# Patient Record
Sex: Female | Born: 1970 | Race: Black or African American | Hispanic: No | Marital: Single | Smoking: Former smoker
Health system: Southern US, Community
[De-identification: ages and names within clinical notes are randomized; demographics above are authoritative.]

## PROBLEM LIST (undated history)

## (undated) DIAGNOSIS — F419 Anxiety disorder, unspecified: Secondary | ICD-10-CM

## (undated) DIAGNOSIS — F32A Depression, unspecified: Secondary | ICD-10-CM

## (undated) DIAGNOSIS — F329 Major depressive disorder, single episode, unspecified: Secondary | ICD-10-CM

## (undated) DIAGNOSIS — M170 Bilateral primary osteoarthritis of knee: Secondary | ICD-10-CM

## (undated) DIAGNOSIS — E785 Hyperlipidemia, unspecified: Secondary | ICD-10-CM

## (undated) HISTORY — DX: Anxiety disorder, unspecified: F41.9

## (undated) HISTORY — DX: Hyperlipidemia, unspecified: E78.5

## (undated) HISTORY — PX: COLONOSCOPY: SHX174

## (undated) HISTORY — DX: Bilateral primary osteoarthritis of knee: M17.0

## (undated) HISTORY — DX: Depression, unspecified: F32.A

## (undated) HISTORY — PX: TUBAL LIGATION: SHX77

## (undated) HISTORY — DX: Major depressive disorder, single episode, unspecified: F32.9

## (undated) HISTORY — PX: POLYPECTOMY: SHX149

## (undated) HISTORY — PX: UPPER GASTROINTESTINAL ENDOSCOPY: SHX188

---

## 2002-10-11 ENCOUNTER — Emergency Department (HOSPITAL_COMMUNITY): Admission: EM | Admit: 2002-10-11 | Discharge: 2002-10-11 | Payer: Self-pay | Admitting: Emergency Medicine

## 2002-11-05 ENCOUNTER — Ambulatory Visit (HOSPITAL_COMMUNITY): Admission: RE | Admit: 2002-11-05 | Discharge: 2002-11-05 | Payer: Self-pay | Admitting: Obstetrics and Gynecology

## 2005-01-10 ENCOUNTER — Ambulatory Visit (HOSPITAL_COMMUNITY): Admission: RE | Admit: 2005-01-10 | Discharge: 2005-01-10 | Payer: Self-pay | Admitting: Obstetrics and Gynecology

## 2009-10-25 ENCOUNTER — Ambulatory Visit (HOSPITAL_COMMUNITY): Admission: RE | Admit: 2009-10-25 | Discharge: 2009-10-25 | Payer: Self-pay | Admitting: Family Medicine

## 2010-06-29 ENCOUNTER — Emergency Department (HOSPITAL_COMMUNITY): Admission: EM | Admit: 2010-06-29 | Discharge: 2010-06-29 | Payer: Self-pay | Admitting: Emergency Medicine

## 2010-10-27 ENCOUNTER — Encounter: Payer: Self-pay | Admitting: Obstetrics and Gynecology

## 2010-10-29 ENCOUNTER — Encounter: Payer: Self-pay | Admitting: Obstetrics and Gynecology

## 2011-02-22 NOTE — Op Note (Signed)
[name], [name]                          ACCOUNT NO.:  [id number]   MEDICAL RECORD NO.:  [id number]                   PATIENT TYPE:  AMB   LOCATION:  DAY                                  FACILITY:  [facility]   PHYSICIAN:  Tilda Burrow, M.D.              DATE OF BIRTH:  [birth year]   DATE OF PROCEDURE:  11/05/2002  DATE OF DISCHARGE:                                 OPERATIVE REPORT   PREOPERATIVE DIAGNOSIS:  Elective sterilization.   POSTOPERATIVE DIAGNOSIS:  Elective sterilization.   PROCEDURE:  Laparoscopic tubal sterilization with Falope rings.   SURGEON:  Christin Bach, M.D.   ASSISTANTAmie Critchley, C.S.T.   ANESTHESIA:  General.   COMPLICATIONS:  None.   FINDINGS:  Normal uterus, tubes, and ovaries.  No evidence of adhesions or  endometriosis.   INDICATION:  Elective permanent sterilization.   DETAILS OF PROCEDURE:  The patient was taken to the operating room, prepped  and  draped for a combined abdominal and vaginal procedure, with Hulka  tenaculum attached to the cervix for uterine  manipulation. Bladder in-and-  out catheterization. An infraumbilical, 1 cm vertical incision, as well as a  transverse suprapubic 1 cm incision. Veress needle was used to introduce  pneumoperitoneum through the umbilical incision with the pneumoperitoneum  easily introduced under 10 mmHg of pressure. Introduction of the Veress  needle was done, carefully elevating the abdominal wall and orienting the  needle toward the pelvis.   The laparoscopic trocar was then carefully introduced into the abdomen using  a similar technique, and the laparoscope was used to visualize normal pelvic  anatomy with no evidence of bleeding or trauma. The suprapubic trocar was  introduced under direct visualization, and then attention was directed to  the left fallopian tube, which was identified up to its fimbriated end,  elevated and a mid-segment loop of the tube was drawn up into the Falope  ring  applier, Marcaine 0.25% applied to the surface of the tube and the  Falope ring applied, inspected, and found to be in satisfactory position.  The opposite tube was then treated in a similar fashion. The mesosalpinx  beneath the Falope ring on each side was then infiltrated with approximately  3 cc of Marcaine 0.25%, using a transabdominal approach with a 22-gauge  spinal needle. Then the laparoscopic equipment was removed after instilling  200 cc of saline into  the abdomen and deflating the abdomen. Subcuticular 4-0 Dexon was used to  close the skin and Steri-Strips were placed on the skin surface. Sponge and  needle counts were correct. The patient tolerated the procedure well, was  awakened, and went to the recovery room in good condition.  Tilda Burrow, M.D.    JVF/MEDQ  D:  11/05/2002  T:  11/05/2002  Job:  [id number]

## 2011-02-22 NOTE — H&P (Signed)
   NAME:  [name] NO.:  [id number]   MEDICAL RECORD NO.:  [id number]                  PATIENT TYPE:   LOCATION:                                       FACILITY:   PHYSICIAN:  Tilda Burrow, M.D.              DATE OF BIRTH:  December 12, 1970   DATE OF ADMISSION:  11/05/2002  DATE OF DISCHARGE:                                HISTORY & PHYSICAL   ADMISSION DIAGNOSIS:  Desire for elective permanent sterilization.   HISTORY OF PRESENT ILLNESS:  This 40 year old female, gravida 3, para 1, now  with a single child three years of age, who has decided she wants permanent  sterilization, has been seen in our office since December on three occasions  to confirm permanent sterilization request.  She understands the permanence  of the requested procedure, declines non-permanent method, has received  Krene's instruction booklets, and has had these details reviewed.  Specific  failure rate of one in 100 is reviewed with the patient, emphasizing Falope  ring application technique.   PAST MEDICAL HISTORY:  Depression and depressive mood disorder, followed at  mental health recently.   PAST SURGICAL HISTORY:  D&C in 1988 for miscarriage.   ALLERGIES:  None.   SOCIAL HISTORY:  Single.  Lives with parents.   PHYSICAL EXAMINATION:  VITAL SIGNS:  Height 5 feet 9 inches, weight 225.  Blood pressure 125/75.  Urinalysis negative.  Pulse 70.  GENERAL:  A healthy, somewhat somber affect African-American female, alert  and oriented x3.  HEENT:  Pupils equal, round, and reactive, extraocular movements intact.  NECK:  Supple, trachea midline.  CHEST:  Clear to auscultation.  ABDOMEN:  Obese, no masses, well-healed, no hernias appreciable.  Nontender  umbilicus.  PELVIC:  External genitalia with normal cervix, multiparous.  Pap smear  class I 09/15/02.  Uterus anterior, normal size, shape, and contour.  Normal  menses on 10/25/01.  Adnexa negative for masses.  EXTREMITIES:  Grossly within normal limits.   ASSESSMENT:  Desire for elective permanent sterilization.   PLAN:  Laparoscopic tubal sterilization with Falope rings on 11/05/02 at 7:30  a.m.                                               Tilda Burrow, M.D.    JVF/MEDQ  D:  10/26/2002  T:  10/26/2002  Job:  [id number]

## 2012-12-14 ENCOUNTER — Other Ambulatory Visit (HOSPITAL_COMMUNITY): Payer: Self-pay | Admitting: *Deleted

## 2012-12-14 DIAGNOSIS — Z1231 Encounter for screening mammogram for malignant neoplasm of breast: Secondary | ICD-10-CM

## 2012-12-17 ENCOUNTER — Ambulatory Visit (HOSPITAL_COMMUNITY)
Admission: RE | Admit: 2012-12-17 | Discharge: 2012-12-17 | Disposition: A | Discharge status: Home / Self Care | Payer: Self-pay | Source: Ambulatory Visit | Attending: *Deleted | Admitting: *Deleted

## 2014-06-17 ENCOUNTER — Other Ambulatory Visit (HOSPITAL_COMMUNITY): Payer: Self-pay | Admitting: *Deleted

## 2014-06-17 DIAGNOSIS — Z1231 Encounter for screening mammogram for malignant neoplasm of breast: Secondary | ICD-10-CM

## 2014-06-23 ENCOUNTER — Ambulatory Visit (HOSPITAL_COMMUNITY): Payer: Self-pay

## 2015-05-03 ENCOUNTER — Other Ambulatory Visit (HOSPITAL_COMMUNITY): Payer: Self-pay | Admitting: Unknown Physician Specialty

## 2015-05-03 DIAGNOSIS — Z1231 Encounter for screening mammogram for malignant neoplasm of breast: Secondary | ICD-10-CM

## 2015-05-08 ENCOUNTER — Ambulatory Visit (HOSPITAL_COMMUNITY)
Admission: RE | Admit: 2015-05-08 | Discharge: 2015-05-08 | Disposition: A | Discharge status: Home / Self Care | Payer: Medicaid Other | Source: Ambulatory Visit | Attending: Unknown Physician Specialty | Admitting: Unknown Physician Specialty

## 2015-05-08 DIAGNOSIS — Z1231 Encounter for screening mammogram for malignant neoplasm of breast: Secondary | ICD-10-CM | POA: Insufficient documentation

## 2016-05-13 ENCOUNTER — Other Ambulatory Visit (HOSPITAL_COMMUNITY): Payer: Self-pay | Admitting: *Deleted

## 2016-05-13 DIAGNOSIS — Z1231 Encounter for screening mammogram for malignant neoplasm of breast: Secondary | ICD-10-CM

## 2016-05-15 ENCOUNTER — Ambulatory Visit (HOSPITAL_COMMUNITY): Payer: Medicaid Other

## 2017-01-07 ENCOUNTER — Ambulatory Visit: Payer: Medicaid Other | Admitting: Family Medicine

## 2017-01-23 ENCOUNTER — Encounter: Payer: Self-pay | Admitting: Family Medicine

## 2017-01-23 ENCOUNTER — Ambulatory Visit (INDEPENDENT_AMBULATORY_CARE_PROVIDER_SITE_OTHER): Payer: BLUE CROSS/BLUE SHIELD | Admitting: Family Medicine

## 2017-01-23 VITALS — BP 134/74 | HR 84 | Temp 97.9°F | Resp 18 | Ht 69.0 in | Wt 268.1 lb

## 2017-01-23 DIAGNOSIS — K7689 Other specified diseases of liver: Secondary | ICD-10-CM | POA: Insufficient documentation

## 2017-01-23 DIAGNOSIS — F418 Other specified anxiety disorders: Secondary | ICD-10-CM | POA: Diagnosis not present

## 2017-01-23 DIAGNOSIS — M170 Bilateral primary osteoarthritis of knee: Secondary | ICD-10-CM | POA: Diagnosis not present

## 2017-01-23 DIAGNOSIS — Z7689 Persons encountering health services in other specified circumstances: Secondary | ICD-10-CM

## 2017-01-23 DIAGNOSIS — M160 Bilateral primary osteoarthritis of hip: Secondary | ICD-10-CM | POA: Diagnosis not present

## 2017-01-23 HISTORY — DX: Bilateral primary osteoarthritis of knee: M17.0

## 2017-01-23 LAB — CBC
HEMATOCRIT: 35.3 % (ref 35.0–45.0)
Hemoglobin: 11.3 g/dL — ABNORMAL LOW (ref 11.7–15.5)
MCH: 28 pg (ref 27.0–33.0)
MCHC: 32 g/dL (ref 32.0–36.0)
MCV: 87.6 fL (ref 80.0–100.0)
MPV: 9.7 fL (ref 7.5–12.5)
PLATELETS: 351 10*3/uL (ref 140–400)
RBC: 4.03 MIL/uL (ref 3.80–5.10)
RDW: 14.7 % (ref 11.0–15.0)
WBC: 6 10*3/uL (ref 3.8–10.8)

## 2017-01-23 NOTE — Patient Instructions (Signed)
Need old records for GYN Need recent record from urgent care  Continue to walk every day that you are able Need mammogram this year  Need blood testing  See a counselor/therapist for your depression  See me in one month

## 2017-01-23 NOTE — Progress Notes (Addendum)
Chief Complaint  Patient presents with  . Establish Care   This is a new patient to establish. Old records are not available. She is here because of a recent urgent care visit. She was found on CAT scan to have a hepatic cyst. She would like explanation of this finding, and appropriate follow-up. It has caused her some concern. We had a discussion regarding simple hepatic cysts, their benign nature, and appropriate follow-up. She has morbid obesity. She weighs 268. She tells me her goal weight is 240 pounds. I corrected her that she really needs to try to get under 200 pounds. She has osteoarthritis of her hips and knees, some difficulty walking. This will only get worse if she doesn't lose weight. Her other big concern is chronic depression. She currently feels depressed. She has been on multiple medications. She prefers not to take medication. No thoughts of harming self or others. She really needs to get into a counselor if she chooses not to take medication. She had a Pap smear couple years ago at [facility]. This record will be requested. She is overdue for mammogram testing. Her last tetanus shot is unknown. She refuses flu shots.   Patient Active Problem List   Diagnosis Date Noted  . Morbid obesity (HCC) 01/23/2017  . Depression with anxiety 01/23/2017  . Primary osteoarthritis of both hips 01/23/2017  . Primary osteoarthritis of both knees 01/23/2017  . Simple hepatic cyst 01/23/2017    No outpatient encounter prescriptions on file as of 01/23/2017.   No facility-administered encounter medications on file as of 01/23/2017.     Past Medical History:  Diagnosis Date  . Anxiety   . Depression   . Primary osteoarthritis of both knees 01/23/2017    Past Surgical History:  Procedure Laterality Date  . TUBAL LIGATION      Social History   Social History  . Marital status: Single    Spouse name: N/A  . Number of children: 1  . Years of education: 82    Occupational History  . teacher asst     [employer]   Social History Main Topics  . Smoking status: Former Games developer  . Smokeless tobacco: Never Used  . Alcohol use No  . Drug use: No  . Sexual activity: Not Currently    Birth control/ protection: None   Other Topics Concern  . Not on file   Social History Narrative   Lives with mother and son [name]   Working on degree in early childhood interdisciplinary studies    Family History  Problem Relation Age of Onset  . Diabetes Mother   . Hypertension Mother   . Stroke Father   . Dementia Father   . Early death Brother     MVA    Review of Systems  Constitutional: Negative for chills, fever and weight loss.  HENT: Negative for congestion and hearing loss.   Eyes: Negative for blurred vision and pain.  Respiratory: Negative for cough and shortness of breath.   Cardiovascular: Negative for chest pain and leg swelling.  Gastrointestinal: Negative for abdominal pain, constipation, diarrhea and heartburn.  Genitourinary: Negative for dysuria and frequency.  Musculoskeletal: Positive for joint pain. Negative for falls and myalgias.       In hips and knees  Neurological: Negative for dizziness, seizures and headaches.  Psychiatric/Behavioral: Positive for depression. Negative for substance abuse and suicidal ideas. The patient is nervous/anxious. The patient does not have insomnia.  BP 134/74 (BP Location: Right Arm, Patient Position: Sitting, Cuff Size: Large)   Pulse 84   Temp 97.9 F (36.6 C) (Temporal)   Resp 18   Ht  (1.753 m)   Wt 268 lb 1.3 oz (121.6 kg)   LMP 01/18/2017 (Exact Date)   SpO2 99%   BMI 39.59 kg/m   Physical Exam  Constitutional: She is oriented to person, place, and time. She appears well-developed and well-nourished.  HENT:  Head: Normocephalic and atraumatic.  Right Ear: External ear normal.  Left Ear: External ear normal.  Mouth/Throat: Oropharynx is clear and moist.  Eyes:  Conjunctivae are normal. Pupils are equal, round, and reactive to light.  Neck: Normal range of motion. Neck supple. No thyromegaly present.  Cardiovascular: Normal rate, regular rhythm and normal heart sounds.   Pulmonary/Chest: Effort normal and breath sounds normal. No respiratory distress.  Abdominal: Soft. Bowel sounds are normal.  Musculoskeletal: Normal range of motion. She exhibits no edema.  Antalgic gait. Valgus deformity of knees. Crepitus with range of motion of knees.  Lymphadenopathy:    She has no cervical adenopathy.  Neurological: She is alert and oriented to person, place, and time.  Skin: Skin is warm and dry.  Psychiatric: Her behavior is normal. Thought content normal.  Depressed mood. Hesitant speech  Nursing note and vitals reviewed.  ASSESSMENT/PLAN:  1. Depression with anxiety - CBC - Comprehensive metabolic panel - Hemoglobin A1c - Lipid panel - VITAMIN D 25 Hydroxy (Vit-D Deficiency, Fractures) - Urinalysis, Routine w reflex microscopic Counseling is recommended.  2. Morbid obesity (HCC) Discussed portion control. Low-fat diet. Daily exercise. Offered nutrition consult, declined.  3. Primary osteoarthritis of both hips Discussed arthritis of hips and knees. Offered orthopedic consult. Patient declines. Weight loss recommended.  4. Primary osteoarthritis of both knees As above  5. Simple hepatic cyst Benign nature of hepatic cysts is discussed. We'll repeat ultrasound or scan of abdomen in one year. We'll check liver function tests.  6. Encounter to establish care with new doctor Old records requested regarding primary care/health maintenance  Patient Instructions  Need old records for GYN Need recent record from urgent care  Continue to walk every day that you are able Need mammogram this year  Need blood testing  See a counselor/therapist for your depression  See me in one month     Eustace Moore, MD

## 2017-01-24 ENCOUNTER — Encounter: Payer: Self-pay | Admitting: Family Medicine

## 2017-01-24 LAB — URINALYSIS, ROUTINE W REFLEX MICROSCOPIC
Bilirubin Urine: NEGATIVE
GLUCOSE, UA: NEGATIVE
Ketones, ur: NEGATIVE
LEUKOCYTES UA: NEGATIVE
NITRITE: NEGATIVE
PH: 6 (ref 5.0–8.0)
Protein, ur: NEGATIVE
SPECIFIC GRAVITY, URINE: 1.028 (ref 1.001–1.035)

## 2017-01-24 LAB — COMPREHENSIVE METABOLIC PANEL
ALK PHOS: 48 U/L (ref 33–115)
ALT: 8 U/L (ref 6–29)
AST: 13 U/L (ref 10–35)
Albumin: 4 g/dL (ref 3.6–5.1)
BUN: 10 mg/dL (ref 7–25)
CALCIUM: 9 mg/dL (ref 8.6–10.2)
CHLORIDE: 105 mmol/L (ref 98–110)
CO2: 25 mmol/L (ref 20–31)
Creat: 0.83 mg/dL (ref 0.50–1.10)
Glucose, Bld: 87 mg/dL (ref 65–99)
POTASSIUM: 4 mmol/L (ref 3.5–5.3)
Sodium: 142 mmol/L (ref 135–146)
TOTAL PROTEIN: 6.9 g/dL (ref 6.1–8.1)
Total Bilirubin: 0.2 mg/dL (ref 0.2–1.2)

## 2017-01-24 LAB — LIPID PANEL
CHOLESTEROL: 226 mg/dL — AB (ref <200)
HDL: 54 mg/dL (ref >50)
LDL CALC: 149 mg/dL — AB (ref <100)
TRIGLYCERIDES: 113 mg/dL (ref <150)
Total CHOL/HDL Ratio: 4.2 Ratio (ref <5.0)
VLDL: 23 mg/dL (ref <30)

## 2017-01-24 LAB — VITAMIN D 25 HYDROXY (VIT D DEFICIENCY, FRACTURES): Vit D, 25-Hydroxy: 13 ng/mL — ABNORMAL LOW (ref 30–100)

## 2017-01-24 LAB — HEMOGLOBIN A1C
Hgb A1c MFr Bld: 5.2 % (ref <5.7)
Mean Plasma Glucose: 103 mg/dL

## 2017-01-24 LAB — URINALYSIS, MICROSCOPIC ONLY
Bacteria, UA: NONE SEEN [HPF]
CRYSTALS: NONE SEEN [HPF]
Casts: NONE SEEN [LPF]
RBC / HPF: NONE SEEN RBC/HPF (ref <=2)
Yeast: NONE SEEN [HPF]

## 2017-01-24 MED ORDER — VITAMIN D (ERGOCALCIFEROL) 1.25 MG (50000 UNIT) PO CAPS: 50000.0000 [IU] | ORAL_CAPSULE | ORAL | 0 refills | Status: DC

## 2017-02-24 ENCOUNTER — Ambulatory Visit (INDEPENDENT_AMBULATORY_CARE_PROVIDER_SITE_OTHER): Payer: BLUE CROSS/BLUE SHIELD | Admitting: Family Medicine

## 2017-02-24 ENCOUNTER — Encounter: Payer: Self-pay | Admitting: Family Medicine

## 2017-02-24 VITALS — BP 132/80 | HR 80 | Temp 97.7°F | Resp 18 | Ht 69.0 in | Wt 275.0 lb

## 2017-02-24 DIAGNOSIS — E559 Vitamin D deficiency, unspecified: Secondary | ICD-10-CM | POA: Insufficient documentation

## 2017-02-24 DIAGNOSIS — E785 Hyperlipidemia, unspecified: Secondary | ICD-10-CM

## 2017-02-24 DIAGNOSIS — F418 Other specified anxiety disorders: Secondary | ICD-10-CM | POA: Diagnosis not present

## 2017-02-24 HISTORY — DX: Hyperlipidemia, unspecified: E78.5

## 2017-02-24 MED ORDER — PHENTERMINE HCL 37.5 MG PO CAPS: 37.5000 mg | ORAL_CAPSULE | ORAL | 2 refills | Status: DC

## 2017-02-24 NOTE — Progress Notes (Signed)
    Chief Complaint  Patient presents with  . Follow-up    1 month  has gained weight Very discouraged Discussed diet and exercise Offered nutrition referral - declined Offered bariatric referral - declined She needs to exercise but has significant painful DJD of knees and hips Offered Ortho referral - declined  We agree to try an appetite suppressant. She will work on portions. Fats, calories and sweets.  Drink only water.  Start riding Moms exercise bike daily , 5 min to start and advance.  Return for a weigh-in in one month,  Discussed vit d Discussed cholesterol from recent labs  Patient Active Problem List   Diagnosis Date Noted  . Vitamin D deficiency 02/24/2017  . HLD (hyperlipidemia) 02/24/2017  . Morbid obesity (HCC) 01/23/2017  . Depression with anxiety 01/23/2017  . Primary osteoarthritis of both hips 01/23/2017  . Primary osteoarthritis of both knees 01/23/2017  . Simple hepatic cyst 01/23/2017    Outpatient Encounter Prescriptions as of 02/24/2017  Medication Sig  . Vitamin D, Ergocalciferol, (DRISDOL) 50000 units CAPS capsule Take 1 capsule (50,000 Units total) by mouth every 7 (seven) days.  . phentermine 37.5 MG capsule Take 1 capsule (37.5 mg total) by mouth every morning.   No facility-administered encounter medications on file as of 02/24/2017.     Allergies  Allergen Reactions  . Aspirin Other (See Comments)    Review of Systems    BP 132/80 (BP Location: Right Arm, Patient Position: Sitting, Cuff Size: Large)   Pulse 80   Temp 97.7 F (36.5 C) (Temporal)   Resp 18   Ht 5\' 9"  (1.753 m)   Wt 275 lb (124.7 kg)   LMP 02/19/2017 (Exact Date)   SpO2 98%   BMI 40.61 kg/m   Physical Exam  ASSESSMENT/PLAN:  1. Depression with anxiety stable  2. Vitamin D deficiency take OTC - VITAMIN D 25 Hydroxy (Vit-D Deficiency, Fractures)  3. Hyperlipidemia, unspecified hyperlipidemia type Diet recomended - Lipid panel  4. Morbid obesity  (HCC) plan of diet and exercise discussed - CBC - COMPLETE METABOLIC PANEL WITH GFR   Patient Instructions  Try to exercise as much as you are able Cut down on portions and fatty foods Stop the sweet drinks Take the phentermine daily Call if you have side effects Come in one month at your convenience for a weight Take vitamin D 2000 daily Watch the cholesterol  See me in 3-6 months Call for poroblems   Eustace MooreYvonne Sue Adasha Boehme, MD

## 2017-02-24 NOTE — Patient Instructions (Signed)
Try to exercise as much as you are able Cut down on portions and fatty foods Stop the sweet drinks Take the phentermine daily Call if you have side effects Come in one month at your convenience for a weight Take vitamin D 2000 daily Watch the cholesterol  See me in 3-6 months Call for poroblems

## 2017-03-28 ENCOUNTER — Other Ambulatory Visit: Payer: Self-pay | Admitting: Family Medicine

## 2017-03-28 DIAGNOSIS — Z1231 Encounter for screening mammogram for malignant neoplasm of breast: Secondary | ICD-10-CM

## 2017-04-08 ENCOUNTER — Ambulatory Visit: Payer: BLUE CROSS/BLUE SHIELD

## 2017-04-10 ENCOUNTER — Encounter: Payer: Self-pay | Admitting: Family Medicine

## 2017-04-10 ENCOUNTER — Ambulatory Visit (INDEPENDENT_AMBULATORY_CARE_PROVIDER_SITE_OTHER): Payer: BLUE CROSS/BLUE SHIELD | Admitting: Family Medicine

## 2017-04-10 VITALS — BP 128/78 | HR 70 | Temp 97.5°F | Resp 18 | Ht 69.0 in | Wt 278.1 lb

## 2017-04-10 DIAGNOSIS — M160 Bilateral primary osteoarthritis of hip: Secondary | ICD-10-CM | POA: Diagnosis not present

## 2017-04-10 DIAGNOSIS — M170 Bilateral primary osteoarthritis of knee: Secondary | ICD-10-CM | POA: Diagnosis not present

## 2017-04-10 DIAGNOSIS — Z0000 Encounter for general adult medical examination without abnormal findings: Secondary | ICD-10-CM | POA: Diagnosis not present

## 2017-04-10 MED ORDER — METHYLPREDNISOLONE 4 MG PO TBPK: ORAL_TABLET | ORAL | 0 refills | Status: DC

## 2017-04-10 MED ORDER — MELOXICAM 15 MG PO TABS: 15.0000 mg | ORAL_TABLET | Freq: Every day | ORAL | 0 refills | Status: DC

## 2017-04-10 NOTE — Progress Notes (Signed)
Chief Complaint  Patient presents with  . Annual Exam   Patient is here for a physical examination. We discussed the blood work that was done at her last visit. She is taking a vitamin D supplement. She is trying to reduce her cholesterol. She has not lost weight. She is unable to exercise. She states that her hip pain has become quite severe. She does take over-the-counter Advil or Aleve. These give her very little relief. She had a CT of her abdomen done recently which did show degeneration of both hip joints. She has not had hip x-rays were seeing a specialist for her hip pain. She states her Pap smear was last year. Her mammogram has been ordered but not yet done. She states her immunizations are up-to-date. Will request those records No other health concerns are identified. Patient does realize that her obesity is exacerbating her orthopedic problems, but feels unable to lose weight. She declines referral for diet information, but does accept a referral for orthopedic surgery to see if her hip pain can be improved, and her activity increased  Patient Active Problem List   Diagnosis Date Noted  . Vitamin D deficiency 02/24/2017  . HLD (hyperlipidemia) 02/24/2017  . Morbid obesity (HCC) 01/23/2017  . Depression with anxiety 01/23/2017  . Primary osteoarthritis of both hips 01/23/2017  . Primary osteoarthritis of both knees 01/23/2017  . Simple hepatic cyst 01/23/2017    Outpatient Encounter Prescriptions as of 04/10/2017  Medication Sig  . cholecalciferol (VITAMIN D) 1000 units tablet Take 2,000 Units by mouth daily.  . meloxicam (MOBIC) 15 MG tablet Take 1 tablet (15 mg total) by mouth daily.  . methylPREDNISolone (MEDROL DOSEPAK) 4 MG TBPK tablet Take as directed  . [DISCONTINUED] phentermine 37.5 MG capsule Take 1 capsule (37.5 mg total) by mouth every morning. (Patient not taking: Reported on 04/10/2017)  . [DISCONTINUED] Vitamin D, Ergocalciferol, (DRISDOL) 50000 units CAPS  capsule Take 1 capsule (50,000 Units total) by mouth every 7 (seven) days. (Patient not taking: Reported on 04/10/2017)   No facility-administered encounter medications on file as of 04/10/2017.     No Known Allergies  Review of Systems  Constitutional: Negative for activity change, appetite change and unexpected weight change.  HENT: Negative for congestion, dental problem, postnasal drip and rhinorrhea.   Eyes: Negative for redness and visual disturbance.  Respiratory: Negative for cough and shortness of breath.   Cardiovascular: Negative for chest pain, palpitations and leg swelling.  Gastrointestinal: Negative for abdominal pain, constipation and diarrhea.  Genitourinary: Negative for difficulty urinating, frequency and menstrual problem.  Musculoskeletal: Positive for arthralgias, gait problem and joint swelling. Negative for back pain.  Neurological: Negative for dizziness and headaches.  Psychiatric/Behavioral: Negative for dysphoric mood and sleep disturbance. The patient is not nervous/anxious.     BP 128/78 (BP Location: Left Arm, Patient Position: Sitting, Cuff Size: Large)   Pulse 70   Temp (!) 97.5 F (36.4 C) (Temporal)   Resp 18   Ht 5\' 9"  (1.753 m)   Wt 278 lb 1.9 oz (126.2 kg)   LMP 04/03/2017 (Exact Date)   SpO2 94%   BMI 41.07 kg/m   Physical Exam BP 128/78 (BP Location: Left Arm, Patient Position: Sitting, Cuff Size: Large)   Pulse 70   Temp (!) 97.5 F (36.4 C) (Temporal)   Resp 18   Ht 5\' 9"  (1.753 m)   Wt 278 lb 1.9 oz (126.2 kg)   LMP 04/03/2017 (Exact Date)   SpO2  94%   BMI 41.07 kg/m   General Appearance:    Alert, cooperative, Appears uncomfortable and in distress, appears stated age. Painful movements, very antalgic gait   Head:    Normocephalic, without obvious abnormality, atraumatic  Eyes:    PERRL, conjunctiva/corneas clear, EOM's intact, fundi    benign, both eyes  Ears:    Normal TM's and external ear canals, both ears  Nose:   Nares  normal, septum midline, mucosa normal, no drainage    or sinus tenderness  Throat:   Lips, mucosa, and tongue normal; teeth and gums normal  Neck:   Supple, symmetrical, trachea midline, no adenopathy;    thyroid:  no enlargement/tenderness/nodules; no carotid   bruit   Back:     Symmetric, no curvature, ROM normal, no CVA tenderness  Lungs:     Clear to auscultation bilaterally, respirations unlabored  Chest Wall:    No tenderness or deformity   Heart:    Regular rate and rhythm, S1 and S2 normal, no murmur, rub   or gallop  Breast Exam:    No tenderness, masses, or nipple abnormality  Abdomen:     Soft, non-tender, bowel sounds active all four quadrants,    no masses, no organomegaly  Genitalia:    Normal female   Extremities:   Extremities normal, atraumatic, no cyanosis or edema. Very limited range of motion hip. Valgus deformity of knees. Knees have crepitus.   Pulses:   2+ and symmetric all extremities  Skin:   Skin color, texture, turgor normal, no rashes or lesions  Lymph nodes:   Cervical, supraclavicular, and axillary nodes normal  Neurologic:   CNII-XII intact, normal strength, sensation and reflexes    throughout    ASSESSMENT/PLAN:  1. Primary osteoarthritis of both hips  - Ambulatory referral to Orthopedic Surgery  2. Primary osteoarthritis of both knees Discussed conservative treatment of her arthritis. This includes diet, exercise, anti-inflammatories, weight reduction. If her pain cannot be improved with these methods, she may need hip surgery.  3. Annual physical exam Health maintenance is up-to-date. Immunization records are pending.   Patient Instructions  Take the medrol pak as directed Take all of day one pills today After the medrol, start mobic once a day with food If needed, may also take acetaminophen Need a flu shot in the fall  I have placed referral to orthopedics We will call about mammogram  See me yearly Call sooner for  problems   Eustace Moore, MD

## 2017-04-10 NOTE — Patient Instructions (Signed)
Take the medrol pak as directed Take all of day one pills today After the medrol, start mobic once a day with food If needed, may also take acetaminophen Need a flu shot in the fall  I have placed referral to orthopedics We will call about mammogram  See me yearly Call sooner for problems

## 2017-04-17 ENCOUNTER — Ambulatory Visit (HOSPITAL_COMMUNITY)
Admission: RE | Admit: 2017-04-17 | Discharge: 2017-04-17 | Disposition: A | Discharge status: Home / Self Care | Payer: BLUE CROSS/BLUE SHIELD | Source: Ambulatory Visit | Attending: Family Medicine | Admitting: Family Medicine

## 2017-04-17 DIAGNOSIS — Z1231 Encounter for screening mammogram for malignant neoplasm of breast: Secondary | ICD-10-CM | POA: Diagnosis present

## 2017-04-23 ENCOUNTER — Ambulatory Visit (INDEPENDENT_AMBULATORY_CARE_PROVIDER_SITE_OTHER): Payer: BLUE CROSS/BLUE SHIELD

## 2017-04-23 ENCOUNTER — Encounter: Payer: Self-pay | Admitting: Orthopaedic Surgery

## 2017-04-23 ENCOUNTER — Ambulatory Visit (INDEPENDENT_AMBULATORY_CARE_PROVIDER_SITE_OTHER): Payer: Self-pay | Admitting: Orthopaedic Surgery

## 2017-04-23 VITALS — BP 122/61 | HR 73 | Temp 97.5°F | Ht 70.0 in | Wt 282.0 lb

## 2017-04-23 DIAGNOSIS — M25552 Pain in left hip: Secondary | ICD-10-CM | POA: Diagnosis not present

## 2017-04-23 DIAGNOSIS — M87052 Idiopathic aseptic necrosis of left femur: Secondary | ICD-10-CM

## 2017-04-23 NOTE — Progress Notes (Signed)
Subjective:    Patient ID: [name], female    DOB: [birth year], 46 y.o.   MRN: [id number]  HPI She has had pain of the left hip on and off for over three years.  At first she had a "catch" at times but it lasted only a second or two.  Over the last six to eight weeks she has had much more pain of the left hip.  It hurts to twist, hurts when she first bears weight on it, hurts now at night.  She has no trauma, no redness, no numbness.  She had a CT scan of the abdomen recently which showed some degenerative changes of the hip.  She saw Dr. Delton See who began Mobic about a week ago.  She had been on prednisone dose pack first which really helped.  The Mobic is helping but she still has pain, but much less.  She has noticed she does not have the ROM of the left hip as she has of the right hip.  She has no distal edema.  She is concerned about the increasing pain and decreased motion.   Review of Systems  HENT: Negative for congestion.   Respiratory: Negative for cough and shortness of breath.   Cardiovascular: Negative for chest pain and leg swelling.  Endocrine: Positive for cold intolerance.  Musculoskeletal: Positive for arthralgias and gait problem.  Allergic/Immunologic: Positive for environmental allergies.  Psychiatric/Behavioral: The patient is nervous/anxious.    Past Medical History:  Diagnosis Date  . Anxiety   . Depression   . HLD (hyperlipidemia) 02/24/2017  . Primary osteoarthritis of both knees 01/23/2017    Past Surgical History:  Procedure Laterality Date  . TUBAL LIGATION      Current Outpatient Prescriptions on File Prior to Visit  Medication Sig Dispense Refill  . cholecalciferol (VITAMIN D) 1000 units tablet Take 2,000 Units by mouth daily.    . meloxicam (MOBIC) 15 MG tablet Take 1 tablet (15 mg total) by mouth daily. 30 tablet 0  . methylPREDNISolone (MEDROL DOSEPAK) 4 MG TBPK tablet Take as directed (Patient not taking: Reported on 04/23/2017) 21 tablet  0   No current facility-administered medications on file prior to visit.     Social History   Social History  . Marital status: Single    Spouse name: N/A  . Number of children: 1  . Years of education: 60   Occupational History  . teacher asst     [employer]   Social History Main Topics  . Smoking status: Former Games developer  . Smokeless tobacco: Never Used  . Alcohol use No  . Drug use: No  . Sexual activity: Not Currently    Birth control/ protection: None   Other Topics Concern  . Not on file   Social History Narrative   Lives with mother and son [name]   Working on degree in early childhood interdisciplinary studies    Family History  Problem Relation Age of Onset  . Diabetes Mother   . Hypertension Mother   . Stroke Father   . Dementia Father   . Early death Brother        MVA    BP 122/61   Pulse 73   Temp (!) 97.5 F (36.4 C)   Ht 5\' 10"  (1.778 m)   Wt 282 lb (127.9 kg)   LMP 04/03/2017 (Exact Date)   BMI 40.46 kg/m  '     Objective:   Physical Exam  Constitutional: She  is oriented to person, place, and time. She appears well-developed and well-nourished.  HENT:  Head: Normocephalic and atraumatic.  Eyes: Pupils are equal, round, and reactive to light. Conjunctivae and EOM are normal.  Neck: Normal range of motion. Neck supple.  Cardiovascular: Normal rate, regular rhythm and intact distal pulses.   Pulmonary/Chest: Effort normal.  Abdominal: Soft.  Musculoskeletal: She exhibits tenderness (Left hip is painful to move, ROM flexion 90, internal 20, external 15, adduction and abduction painful, extension 10, NV intact.  Limp left.  No pain over hip with compression.).  Neurological: She is alert and oriented to person, place, and time. She displays normal reflexes. No cranial nerve deficit. She exhibits normal muscle tone. Coordination normal.  Skin: Skin is warm and dry.  Psychiatric: She has a normal mood and affect. Her behavior is normal.  Judgment and thought content normal.  Vitals reviewed.   X-rays were done of the hip on the left and AP pelvis, reported separately.      Assessment & Plan:   Encounter Diagnoses  Name Primary?  . Pain of left hip joint Yes  . Avascular necrosis of hip, left (HCC)    I have explained about avascular necrosis of the hip.  I have ordered a MRI of the hip.  She is to continue the Mobic.  Return after MRI has been done.  She will have an open unit MRI.  Call if any problem.  Precautions discussed.   Electronically Signed Darreld McleanWayne Amous Crewe, MD 7/18/201811:01 AM

## 2017-04-23 NOTE — Patient Instructions (Signed)
Out of work 

## 2017-05-06 ENCOUNTER — Encounter: Payer: Self-pay | Admitting: Orthopaedic Surgery

## 2017-05-06 ENCOUNTER — Ambulatory Visit (INDEPENDENT_AMBULATORY_CARE_PROVIDER_SITE_OTHER): Payer: BLUE CROSS/BLUE SHIELD | Admitting: Orthopaedic Surgery

## 2017-05-06 VITALS — BP 139/79 | HR 65 | Temp 98.3°F | Ht 69.0 in | Wt 284.0 lb

## 2017-05-06 DIAGNOSIS — M25552 Pain in left hip: Secondary | ICD-10-CM | POA: Diagnosis not present

## 2017-05-06 MED ORDER — NAPROXEN 500 MG PO TABS: 500.0000 mg | ORAL_TABLET | Freq: Two times a day (BID) | ORAL | 5 refills | Status: DC

## 2017-05-06 NOTE — Progress Notes (Signed)
Patient NW:GNFAOZH:[name], female DOB:Jun 11, 1971, 46 y.o. YQM:[id number]  Chief Complaint  Patient presents with  . Results    MRI LEFT HIP    HPI  [name] is a 46 y.o. female who has left hip pain.  She had [facility]. It showed mild left hip osteoarthritis with advanced left acetabular osteochondral changes.  The right hip had moderated changes of the acetabulum.  She also has thickening of the endometrium.  I will have her see her GYN doctor about this.  A copy of the MRI report given the patient.  She has had little help from the Mobic.  I will change to Naprosyn. Precautions given.  She may need total hip in the future.  I have given note for light duty work when school resumes. HPI  Body mass index is 41.94 kg/m.  ROS  Review of Systems  HENT: Negative for congestion.   Respiratory: Negative for cough and shortness of breath.   Cardiovascular: Negative for chest pain and leg swelling.  Endocrine: Positive for cold intolerance.  Musculoskeletal: Positive for arthralgias and gait problem.  Allergic/Immunologic: Positive for environmental allergies.  Psychiatric/Behavioral: The patient is nervous/anxious.     Past Medical History:  Diagnosis Date  . Anxiety   . Depression   . HLD (hyperlipidemia) 02/24/2017  . Primary osteoarthritis of both knees 01/23/2017    Past Surgical History:  Procedure Laterality Date  . TUBAL LIGATION      Family History  Problem Relation Age of Onset  . Diabetes Mother   . Hypertension Mother   . Stroke Father   . Dementia Father   . Early death Brother        MVA    Social History Social History  Substance Use Topics  . Smoking status: Former Games developermoker  . Smokeless tobacco: Never Used  . Alcohol use No    No Known Allergies  Current Outpatient Prescriptions  Medication Sig Dispense Refill  . cholecalciferol (VITAMIN D) 1000 units tablet Take 2,000 Units by mouth daily.    .  methylPREDNISolone (MEDROL DOSEPAK) 4 MG TBPK tablet Take as directed (Patient not taking: Reported on 04/23/2017) 21 tablet 0  . naproxen (NAPROSYN) 500 MG tablet Take 1 tablet (500 mg total) by mouth 2 (two) times daily with a meal. 60 tablet 5   No current facility-administered medications for this visit.      Physical Exam  Blood pressure 139/79, pulse 65, temperature 98.3 F (36.8 C), height 5\' 9"  (1.753 m), weight 284 lb (128.8 kg).  Constitutional: overall normal hygiene, normal nutrition, well developed, normal grooming, normal body habitus. Assistive device:none  Musculoskeletal: gait and station Limp left, muscle tone and strength are normal, no tremors or atrophy is present.  .  Neurological: coordination overall normal.  Deep tendon reflex/nerve stretch intact.  Sensation normal.  Cranial nerves II-XII intact.   Skin:   Normal overall no scars, lesions, ulcers or rashes. No psoriasis.  Psychiatric: Alert and oriented x 3.  Recent memory intact, remote memory unclear.  Normal mood and affect. Well groomed.  Good eye contact.  Cardiovascular: overall no swelling, no varicosities, no edema bilaterally, normal temperatures of the legs and arms, no clubbing, cyanosis and good capillary refill.  Lymphatic: palpation is normal.  Left hip tender with decreased motion, more internally. NV intact.  Limp left.  The patient has been educated about the nature of the problem(s) and counseled on treatment options.  The  patient appeared to understand what I have discussed and is in agreement with it.  Encounter Diagnosis  Name Primary?  . Pain of left hip joint Yes    PLAN Call if any problems.  Precautions discussed.  Continue current medications.   Return to clinic prn.  She will try the Naprosyn and let me know her response. She requested to just keep return visits on her calling me.   Electronically Signed Darreld McleanWayne Dollie Bressi, MD 7/31/201811:19 AM

## 2017-05-06 NOTE — Patient Instructions (Signed)
Light duty work.  Avoid squatting.  Avoid prolonged standing.

## 2017-05-10 ENCOUNTER — Encounter (HOSPITAL_COMMUNITY): Payer: Self-pay | Admitting: Cardiology

## 2017-05-10 ENCOUNTER — Emergency Department (HOSPITAL_COMMUNITY)
Admission: EM | Admit: 2017-05-10 | Discharge: 2017-05-10 | Disposition: A | Discharge status: Home / Self Care | Payer: BLUE CROSS/BLUE SHIELD | Attending: Emergency Medicine | Admitting: Emergency Medicine

## 2017-05-10 DIAGNOSIS — M1612 Unilateral primary osteoarthritis, left hip: Secondary | ICD-10-CM

## 2017-05-10 DIAGNOSIS — M25552 Pain in left hip: Secondary | ICD-10-CM | POA: Diagnosis present

## 2017-05-10 DIAGNOSIS — Z79899 Other long term (current) drug therapy: Secondary | ICD-10-CM | POA: Diagnosis not present

## 2017-05-10 MED ORDER — DEXAMETHASONE SODIUM PHOSPHATE 10 MG/ML IJ SOLN
10.0000 mg | Freq: Once | INTRAMUSCULAR | Status: AC
  Administered 2017-05-10: 10 mg via INTRAMUSCULAR
  Filled 2017-05-10: qty 1

## 2017-05-10 MED ORDER — TRAMADOL HCL 50 MG PO TABS
100.0000 mg | ORAL_TABLET | Freq: Once | ORAL | Status: AC
  Administered 2017-05-10: 100 mg via ORAL
  Filled 2017-05-10: qty 2

## 2017-05-10 MED ORDER — DIAZEPAM 5 MG PO TABS
5.0000 mg | ORAL_TABLET | Freq: Once | ORAL | Status: AC
  Administered 2017-05-10: 5 mg via ORAL
  Filled 2017-05-10: qty 1

## 2017-05-10 MED ORDER — CYCLOBENZAPRINE HCL 10 MG PO TABS: 10.0000 mg | ORAL_TABLET | Freq: Three times a day (TID) | ORAL | 0 refills | Status: DC

## 2017-05-10 MED ORDER — TRAMADOL HCL 50 MG PO TABS: 50.0000 mg | ORAL_TABLET | Freq: Four times a day (QID) | ORAL | 0 refills | Status: DC | PRN

## 2017-05-10 MED ORDER — METHYLPREDNISOLONE 4 MG PO TBPK: ORAL_TABLET | ORAL | 0 refills | Status: DC

## 2017-05-10 NOTE — ED Notes (Signed)
History of arthritis both hips, per pt.  C/o pain to left hip, rating pain 10/10.  Having difficulty ambulating.

## 2017-05-10 NOTE — Discharge Instructions (Signed)
Review of your x-rays suggest osteoarthritis involving your hip. Your vital signs are within normal limits today. I do not find evidence of acute infection. Please use a heating pad to your hip area. Use Flexeril, Medrol Dosepak, and Ultram over the weekend to assist with your discomfort. Please schedule an appointment with Dr. Delton SeeNelson this week for assistance with your pain management. Flexeril may cause drowsiness, as well as Ultram. Please use these 2 medications with caution.

## 2017-05-10 NOTE — ED Provider Notes (Signed)
[facility]-EMERGENCY DEPT Provider Note   CSN: [id number] Arrival date & time: 05/10/17  1251     History   Chief Complaint Chief Complaint  Patient presents with  . Hip Pain    HPI [name] is a 46 y.o. female.  Patient is a 46 year old female who presents to the emergency department with complaint of left hip pain.  The patient gives history that she has arthritis in both hips. She is being seen by orthopedics with Dr. Hilda LiasKeeling. She has been placed on medication, but she states the medication is not helping. She was seen by Dr. Hilda LiasKeeling approximately a week ago, and she feels as though symptoms are getting worse. She has problems getting in and out of the chair and as well as standing. There's been no loss of bowel or bladder function reported. The patient has no problem feeling in between the saddle area and the her private areas. She's not had any recent fall or injury. She presents to the emergency department at this time for assistance with her pain.      Past Medical History:  Diagnosis Date  . Anxiety   . Depression   . HLD (hyperlipidemia) 02/24/2017  . Primary osteoarthritis of both knees 01/23/2017    Patient Active Problem List   Diagnosis Date Noted  . Vitamin D deficiency 02/24/2017  . HLD (hyperlipidemia) 02/24/2017  . Morbid obesity (HCC) 01/23/2017  . Depression with anxiety 01/23/2017  . Primary osteoarthritis of both hips 01/23/2017  . Primary osteoarthritis of both knees 01/23/2017  . Simple hepatic cyst 01/23/2017    Past Surgical History:  Procedure Laterality Date  . TUBAL LIGATION      OB History    No data available       Home Medications    Prior to Admission medications   Medication Sig Start Date End Date Taking? Authorizing Provider  cholecalciferol (VITAMIN D) 1000 units tablet Take 2,000 Units by mouth daily.    [provider]  methylPREDNISolone (MEDROL DOSEPAK) 4 MG TBPK tablet Take as directed Patient not taking:  Reported on 04/23/2017 04/10/17   Eustace MooreNelson, Yvonne Sue, MD  naproxen (NAPROSYN) 500 MG tablet Take 1 tablet (500 mg total) by mouth 2 (two) times daily with a meal. 05/06/17   Darreld McleanKeeling, Wayne, MD    Family History Family History  Problem Relation Age of Onset  . Diabetes Mother   . Hypertension Mother   . Stroke Father   . Dementia Father   . Early death Brother        MVA    Social History Social History  Substance Use Topics  . Smoking status: Former Games developermoker  . Smokeless tobacco: Never Used  . Alcohol use No     Allergies   Aspirin   Review of Systems Review of Systems  Constitutional: Negative for activity change.       All ROS Neg except as noted in HPI  HENT: Negative for nosebleeds.   Eyes: Negative for photophobia and discharge.  Respiratory: Negative for cough, shortness of breath and wheezing.   Cardiovascular: Negative for chest pain and palpitations.  Gastrointestinal: Negative for abdominal pain and blood in stool.  Genitourinary: Negative for dysuria, frequency and hematuria.  Musculoskeletal: Positive for arthralgias, back pain and gait problem. Negative for neck pain.  Skin: Negative.   Neurological: Negative for dizziness, seizures and speech difficulty.  Psychiatric/Behavioral: Negative for confusion and hallucinations. The patient is nervous/anxious.      Physical Exam Updated  Vital Signs BP 121/65 (BP Location: Left Arm)   Pulse 72   Temp 98.1 F (36.7 C) (Oral)   Resp 18   Ht 5\' 9"  (1.753 m)   Wt 127 kg (280 lb)   SpO2 98%   BMI 41.35 kg/m   Physical Exam  Constitutional: She is oriented to person, place, and time. She appears well-developed and well-nourished.  Non-toxic appearance.  HENT:  Head: Normocephalic.  Right Ear: Tympanic membrane and external ear normal.  Left Ear: Tympanic membrane and external ear normal.  Eyes: Pupils are equal, round, and reactive to light. EOM and lids are normal.  Neck: Normal range of motion. Neck supple.  Carotid bruit is not present.  Cardiovascular: Normal rate, regular rhythm, normal heart sounds, intact distal pulses and normal pulses.   Pulmonary/Chest: Breath sounds normal. No respiratory distress.  Abdominal: Soft. Bowel sounds are normal. There is no tenderness. There is no guarding.  Musculoskeletal: She exhibits tenderness.  There is some pain with attempted range of motion of the right hip and right lower extremity. The examination is limited due to the patient's cooperation.  The patient will not allow examination of the left hip because she says it hurts. She says however that she can move it if she had 2, but it hurts and she does not want to move it at this time. Dorsalis pedis pulses 2+ bilaterally. Capillary refill is less than 2 seconds bilaterally. There no temperature changes of the right or left lower extremity.  Lymphadenopathy:       Head (right side): No submandibular adenopathy present.       Head (left side): No submandibular adenopathy present.    She has no cervical adenopathy.  Neurological: She is alert and oriented to person, place, and time. She has normal strength. No cranial nerve deficit or sensory deficit.  Skin: Skin is warm and dry.  Psychiatric: She has a normal mood and affect. Her speech is normal.  Nursing note and vitals reviewed.    ED Treatments / Results  Labs (all labs ordered are listed, but only abnormal results are displayed) Labs Reviewed - No data to display  EKG  EKG Interpretation None       Radiology No results found.  Procedures Procedures (including critical care time)  Medications Ordered in ED Medications  dexamethasone (DECADRON) injection 10 mg (not administered)  diazepam (VALIUM) tablet 5 mg (not administered)     Initial Impression / Assessment and Plan / ED Course  I have reviewed the triage vital signs and the nursing notes.  Pertinent labs & imaging results that were available during my care of the  patient were reviewed by me and considered in my medical decision making (see chart for details).       Final Clinical Impressions(s) / ED Diagnoses MDM Vital signs within normal limits. Pulse oximetry is 98% on room air.  I reviewed the x-ray of the hip by Dr. Hilda LiasKeeling. The patient has degenerative changes present and there is some question of avascular necrosis. An MRI is pending.  Prescription for Flexeril, ibuprofen, and Ultram given to the patient. The patient is advised to see Dr. Hilda LiasKeeling next week for assistance with her ongoing pain. Patient is in agreement with this plan.    Final diagnoses:  Acute pain of left hip  Primary osteoarthritis of left hip    New Prescriptions New Prescriptions   CYCLOBENZAPRINE (FLEXERIL) 10 MG TABLET    Take 1 tablet (10 mg total) by  mouth 3 (three) times daily.   METHYLPREDNISOLONE (MEDROL) 4 MG TBPK TABLET    6,5,4,3,2,1 - take with food   TRAMADOL (ULTRAM) 50 MG TABLET    Take 1 tablet (50 mg total) by mouth every 6 (six) hours as needed.     Ivery Quale, PA-C 05/10/17 1444    Long, Arlyss Repress, MD 05/11/17 2006

## 2017-05-10 NOTE — ED Triage Notes (Signed)
Left hip pain (worsening) since last night.  Denies injury.  Has been seeing Dr. Hilda LiasKeeling with same.

## 2017-05-15 ENCOUNTER — Telehealth: Payer: Self-pay | Admitting: Orthopaedic Surgery

## 2017-05-15 NOTE — Telephone Encounter (Signed)
Patient called and stated the anti-inflammatory was not helping. She was previously given a light duty work note.  She wants to know if she can get an OOW note for next week, the 13th through the 17th.  Please advise

## 2017-05-19 NOTE — Telephone Encounter (Signed)
OK.  Give additional work note.

## 2017-06-17 ENCOUNTER — Telehealth: Payer: Self-pay | Admitting: Orthopedic Surgery

## 2017-06-24 ENCOUNTER — Encounter: Payer: Self-pay | Admitting: Family Medicine

## 2017-06-24 ENCOUNTER — Ambulatory Visit (INDEPENDENT_AMBULATORY_CARE_PROVIDER_SITE_OTHER): Payer: BC Managed Care – PPO | Admitting: Family Medicine

## 2017-06-24 VITALS — BP 126/70 | HR 92 | Temp 97.0°F | Resp 16 | Ht 69.0 in | Wt 283.1 lb

## 2017-06-24 DIAGNOSIS — F418 Other specified anxiety disorders: Secondary | ICD-10-CM | POA: Diagnosis not present

## 2017-06-24 MED ORDER — DULOXETINE HCL 30 MG PO CPEP: 30.0000 mg | ORAL_CAPSULE | Freq: Every day | ORAL | 3 refills | Status: DC

## 2017-06-24 NOTE — Progress Notes (Signed)
Chief Complaint  Patient presents with  . 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  Staying out.  neglecting school.  She fears for his safety and his future.  He is "mean" to her.  She is not sleeping well.  She is unhappy today and feels her life is "too hard".  Insinuates it is not worth it.   We discuss stress and depression.  She promises not to harm herself.  Agrees to SSRI - duloxetine chosen due to the indication for her chronic pain  Patient Active Problem List   Diagnosis Date Noted  . Vitamin D deficiency 02/24/2017  . HLD (hyperlipidemia) 02/24/2017  . Morbid obesity (HCC) 01/23/2017  . Depression with anxiety 01/23/2017  . Primary osteoarthritis of both hips 01/23/2017  . Primary osteoarthritis of both knees 01/23/2017  . Simple hepatic cyst 01/23/2017    Outpatient Encounter Prescriptions as of 06/24/2017  Medication Sig  . cholecalciferol (VITAMIN D) 1000 units tablet Take 2,000 Units by mouth daily.  . cyclobenzaprine (FLEXERIL) 10 MG tablet Take 1 tablet (10 mg total) by mouth 3 (three) times daily.  . DULoxetine (CYMBALTA) 30 MG capsule Take 1 capsule (30 mg total) by mouth daily.  . [DISCONTINUED] methylPREDNISolone (MEDROL) 4 MG TBPK tablet 6,5,4,3,2,1 - take with food (Patient not taking: Reported on 06/24/2017)  . [DISCONTINUED] naproxen (NAPROSYN) 500 MG tablet Take 1 tablet (500 mg total) by mouth 2 (two) times daily with a meal. (Patient not taking: Reported on 06/24/2017)  . [DISCONTINUED] traMADol (ULTRAM) 50 MG tablet Take 1 tablet (50 mg total) by mouth every 6 (six) hours as needed. (Patient not taking: Reported on 06/24/2017)   No facility-administered encounter medications on file as of 06/24/2017.     Allergies    Allergen Reactions  . Aspirin Other (See Comments)    Bloody stools     Review of Systems  Constitutional: Negative for activity change, appetite change and unexpected weight change.  HENT: Negative for congestion, dental problem, postnasal drip and rhinorrhea.   Eyes: Negative for redness and visual disturbance.  Respiratory: Negative for cough and shortness of breath.   Cardiovascular: Negative for chest pain, palpitations and leg swelling.  Gastrointestinal: Negative for abdominal pain, constipation and diarrhea.  Genitourinary: Negative for difficulty urinating, frequency and menstrual problem.  Musculoskeletal: Positive for arthralgias, gait problem and joint swelling. Negative for back pain.  Neurological: Negative for dizziness and headaches.  Psychiatric/Behavioral: Positive for dysphoric mood, sleep disturbance and suicidal ideas. The patient is not nervous/anxious.    BP 126/70 (BP Location: Right Arm, Patient Position: Sitting, Cuff Size: Large)   Pulse 92   Temp (!) 97 F (36.1 C) (Temporal)   Resp 16   Ht  (1.753 m)   Wt 283 lb 1.9 oz (128.4 kg)   SpO2 97%   BMI 41.81 kg/m   Physical Exam  Constitutional: She is oriented to person, place, and time. She appears well-developed and well-nourished. She appears distressed.  HENT:  Head: Normocephalic and atraumatic.  Mouth/Throat: Oropharynx is clear and moist.  Eyes: Pupils are equal, round, and reactive to light. Conjunctivae are normal.  Cardiovascular: Normal rate, regular rhythm and normal heart sounds.   Pulmonary/Chest: Effort normal and breath sounds normal.  Musculoskeletal:  Can hardly put weight on arthritis leg - very antalgic , halting gait  Neurological: She is alert and oriented to person, place, and time.  Psychiatric: Her speech is normal and behavior is normal. Judgment and thought content normal. Her affect is labile. Cognition and memory are normal. She exhibits a depressed mood. She expresses  no suicidal ideation. She expresses no suicidal plans.  Talks about not living but promises has no plan and will call me if worse    ASSESSMENT/PLAN:  1. Depression with anxiety  Greater than 50% of this visit was spent in counseling and coordinating care.  Total face to face time: 25 min discussing depression, stress, child rearing, self care. Medications for depression    Patient Instructions  Take the duloxetine daily See me in one month Call if it causes ANY side effects or problems   Eustace Moore, MD

## 2017-06-24 NOTE — Patient Instructions (Signed)
Take the duloxetine daily See me in one month Call if it causes ANY side effects or problems

## 2017-06-30 ENCOUNTER — Encounter: Payer: Self-pay | Admitting: Orthopedic Surgery

## 2017-06-30 ENCOUNTER — Ambulatory Visit (INDEPENDENT_AMBULATORY_CARE_PROVIDER_SITE_OTHER): Payer: BC Managed Care – PPO | Admitting: Orthopedic Surgery

## 2017-06-30 ENCOUNTER — Ambulatory Visit (INDEPENDENT_AMBULATORY_CARE_PROVIDER_SITE_OTHER): Payer: BC Managed Care – PPO

## 2017-06-30 VITALS — BP 130/70 | HR 65 | Ht 69.0 in | Wt 283.0 lb

## 2017-06-30 DIAGNOSIS — M5442 Lumbago with sciatica, left side: Secondary | ICD-10-CM | POA: Diagnosis not present

## 2017-06-30 DIAGNOSIS — M5441 Lumbago with sciatica, right side: Secondary | ICD-10-CM

## 2017-06-30 DIAGNOSIS — M160 Bilateral primary osteoarthritis of hip: Secondary | ICD-10-CM | POA: Diagnosis not present

## 2017-06-30 MED ORDER — METHOCARBAMOL 500 MG PO TABS: 500.0000 mg | ORAL_TABLET | Freq: Three times a day (TID) | ORAL | 1 refills | Status: DC

## 2017-06-30 MED ORDER — IBUPROFEN 800 MG PO TABS: 800.0000 mg | ORAL_TABLET | Freq: Three times a day (TID) | ORAL | 1 refills | Status: DC | PRN

## 2017-06-30 NOTE — Progress Notes (Signed)
Patient ID: [name], female   DOB: 1970/11/12, 46 y.o.   MRN: [id number]  Chief Complaint  Patient presents with  . Hip Pain    Bialateral    HPI [name] is a 46 y.o. female.  Left greater than right hip pain  This 46 year old female has plain film evidence of osteoarthritis of both hips. She had an MRI which showed no avascular necrosis just osteoarthritis.  She comes in complaining of bilateral hip pain which is localized by her pointing to her lower back and then left leg pain is lateral leg pain radiating to her knee. She denies any groin pain or anterior thigh pain. She does report intermittent difficulty getting out of the chair or getting up from a seated position.  She describes a dull mild constant achy pain in the areas described and she has had this now for over a year but worse in the last 3 months  Review of Systems Review of Systems  Constitutional: Negative for unexpected weight change.  HENT: Negative.   Respiratory: Negative.   Cardiovascular: Negative.   Musculoskeletal: Positive for arthralgias, back pain, gait problem and myalgias.  Neurological: Negative for weakness and numbness.   (2 MINIMUM)  Past Medical History:  Diagnosis Date  . Anxiety   . Depression   . HLD (hyperlipidemia) 02/24/2017  . Primary osteoarthritis of both knees 01/23/2017    Past Surgical History:  Procedure Laterality Date  . TUBAL LIGATION      Social History Social History  Substance Use Topics  . Smoking status: Former Games developer  . Smokeless tobacco: Never Used  . Alcohol use No    Allergies  Allergen Reactions  . Aspirin Other (See Comments)    Bloody stools     No outpatient prescriptions have been marked as taking for the 06/30/17 encounter (Office Visit) with Vickki Hearing, MD.      Physical Exam Physical Exam 1.There were no vitals taken for this visit.  2. Gen. appearance. The patient is well-developed and well-nourished, grooming and  hygiene are normal. There are no gross congenital abnormalities  3. The patient is alert and oriented to person place and time  4. Mood and affect are normal  5. Ambulation She walks but she was very slow she favors the left side   I checked her back first. She has tenderness in the lower back and both left and right glutei. She has tenderness along the lateral aspect of the left leg down to the left knee. Her back flexion-extension is abnormally minimal her muscle tension is normal there is no skin deficit  Hip flexion on the right and left are 90 she has no pain with internal rotation which measures approximate 15 external rotation is 25 and abduction is 30 in both hips. She had no pain with hip flexion or extension. Both hips are stable. Distally she had normal motor exam both leg skin was normal she had no sensory deficits or reflex deficits however she had painful reaction to straight leg raising of both legs at approximate 40 with pain in her lower back  Distally she had minimal peripheral edema and normal pulses in both legs   MEDICAL DECISION MAKING:    Data Reviewed MRI report  IMPRESSION: Mild left hip osteoarthritis with advanced left acetabular osteochondral changes  Mild right hip osteoarthritis with moderate right acetabular osteochondral changes.  Chronic degenerative left anterior labral tear.  Mild thickening of the endometrium. This may be related to  menstrual cycle if the patient is premenopausal. Trace free fluid in the pelvis may be physiologic.  Electronically Signed by: Jeanne Ivan  Plain films of the lumbar spine show scoliosis which the patient does remember having its mildest lumbar. She also has facet arthritis between L4 and S1.  Assessment Encounter Diagnoses  Name Primary?  . Bilateral low back pain with bilateral sciatica, unspecified chronicity Yes  . Bilateral hip joint arthritis     Plan She has bilateral hip arthritis but this is  minimally symptomatic  She seems to have more back pain than hip pain  Therefore we will start physical therapy for her back we will start her on some new medications. She says she can handle ibuprofen though she had bleeding with aspirin therapy.  We will also had a muscle relaxer  Meds ordered this encounter  Medications  . ibuprofen (ADVIL,MOTRIN) 800 MG tablet    Sig: Take 1 tablet (800 mg total) by mouth every 8 (eight) hours as needed.    Dispense:  90 tablet    Refill:  1; Note patient says she has handled ibuprofen in the past with no evidence of bleeding   . methocarbamol (ROBAXIN) 500 MG tablet    Sig: Take 1 tablet (500 mg total) by mouth 3 (three) times daily.    Dispense:  60 tablet    Refill:  1     Fuller Canada 06/30/2017, 2:56 PM

## 2017-06-30 NOTE — Patient Instructions (Addendum)
Start physical therapy  Start new muscle relaxer and ibuprofen prescriptions at the pharmacy  Follow-up in 4 weeks Chronic Back Pain When back pain lasts longer than 3 months, it is called chronic back pain.The cause of your back pain may not be known. Some common causes include:  Wear and tear (degenerative disease) of the bones, ligaments, or disks in your back.  Inflammation and stiffness in your back (arthritis).  People who have chronic back pain often go through certain periods in which the pain is more intense (flare-ups). Many people can learn to manage the pain with home care. Follow these instructions at home: Pay attention to any changes in your symptoms. Take these actions to help with your pain: Activity  Avoid bending and activities that make the problem worse.  Do not sit or stand in one place for long periods of time.  Take brief periods of rest throughout the day. This will reduce your pain. Resting in a lying or standing position is usually better than sitting to rest.  When you are resting for longer periods, mix in some mild activity or stretching between periods of rest. This will help to prevent stiffness and pain.  Get regular exercise. Ask your health care provider what activities are safe for you.  Do not lift anything that is heavier than 10 lb (4.5 kg). Always use proper lifting technique, which includes: ? Bending your knees. ? Keeping the load close to your body. ? Avoiding twisting. Managing pain  If directed, apply ice to the painful area. Your health care provider may recommend applying ice during the first 24-48 hours after a flare-up begins. ? Put ice in a plastic bag. ? Place a towel between your skin and the bag. ? Leave the ice on for 20 minutes, 2-3 times per day.  After icing, apply heat to the affected area as often as told by your health care provider. Use the heat source that your health care provider recommends, such as a moist heat  pack or a heating pad. ? Place a towel between your skin and the heat source. ? Leave the heat on for 20-30 minutes. ? Remove the heat if your skin turns bright red. This is especially important if you are unable to feel pain, heat, or cold. You may have a greater risk of getting burned.  Try soaking in a warm tub.  Take over-the-counter and prescription medicines only as told by your health care provider.  Keep all follow-up visits as told by your health care provider. This is important. Contact a health care provider if:  You have pain that is not relieved with rest or medicine. Get help right away if:  You have weakness or numbness in one or both of your legs or feet.  You have trouble controlling your bladder or your bowels.  You have nausea or vomiting.  You have pain in your abdomen.  You have shortness of breath or you faint. This information is not intended to replace advice given to you by your health care provider. Make sure you discuss any questions you have with your health care provider. Document Released: 10/31/2004 Document Revised: 02/01/2016 Document Reviewed: 03/13/2015 Elsevier Interactive Patient Education  2018 Else[employer] Inc.  Radicular Pain Radicular pain is a type of pain that spreads from your back or neck along a spinal nerve. Spinal nerves are nerves that leave the spinal cord and go to the muscles. Radicular pain occurs when one of these nerves becomes irritated or  squeezed (compressed). Radicular pain is sometimes called radiculopathy, radiculitis, or a pinched nerve. When you have this type of pain, you may also have weakness, numbness, or tingling in the area of your body that is supplied by the nerve. The pain may feel sharp and burning. Spinal nerves leave the spinal cord through openings between the 24 bones (vertebrae) that make up the spine. Radicular pain is often caused by something pushing on a spinal nerve. This pushing may be done by a vertebra or  by one of the round cushions between vertebrae (intervertebral disks). This can result from an injury, from wear and tear or aging of a disk, or from the growth of a bone spur that pushes on the nerve. Radicular pain can occur in various areas depending on which spinal nerve is affected:  Cervical radicular pain occurs in the neck. You may also feel pain, numbness, weakness, or tingling in the arms.  Thoracic radicular pain occurs in the mid-spine area. You would feel this pain in the back and chest. This type is rare.  Lumbar radicular pain occurs in the lower back area. You would feel this pain as low back pain. You may feel pain, numbness, weakness, or tingling in the buttocks or legs. Sciatica is a type of lumbar radicular pain that shoots down the back of the leg.  Radicular pain often goes away when you follow instructions from your health care provider for relieving pain at home. Follow these instructions at home: Managing pain  If directed, apply ice to the affected area: ? Put ice in a plastic bag. ? Place a towel between your skin and the bag. ? Leave the ice on for 20 minutes, 2-3 times a day.  If directed, apply heat to the affected area as often as told by your health care provider. Use the heat source that your health care provider recommends, such as a moist heat pack or a heating pad. ? Place a towel between your skin and the heat source. ? Leave the heat on for 20-30 minutes. ? Remove the heat if your skin turns bright red. This is especially important if you are unable to feel pain, heat, or cold. You may have a greater risk of getting burned. Activity   Do not sit or rest in bed for long periods of time.  Try to stay as active as possible. Ask your health care provider what type of exercise or activity is best for you.  Avoid activities that make your pain worse, such as bending and lifting.  Do not lift anything that is heavier than 10 lb (4.5 kg). Practice using  proper technique when lifting items. Proper lifting technique involves bending your knees and rising up.  Do strength and range-of-motion exercises only as told by your health care provider. General instructions  Take over-the-counter and prescription medicines only as told by your health care provider.  Pay attention to any changes in your symptoms.  Keep all follow-up visits as told by your health care provider. This is important. Contact a health care provider if:  Your pain and other symptoms get worse.  Your pain medicine is not helping.  Your pain has not improved after a few weeks of home care.  You have a fever. Get help right away if:  You have severe pain, weakness, or numbness.  You have difficulty with bladder or bowel control. This information is not intended to replace advice given to you by your health care provider. Make sure  you discuss any questions you have with your health care provider. Document Released: 10/31/2004 Document Revised: 02/29/2016 Document Reviewed: 04/19/2015 Elsevier Interactive Patient Education  [employer].

## 2017-07-23 ENCOUNTER — Encounter (HOSPITAL_COMMUNITY): Payer: Self-pay | Admitting: Physical Therapy

## 2017-07-23 ENCOUNTER — Ambulatory Visit (HOSPITAL_COMMUNITY): Payer: BC Managed Care – PPO | Attending: Orthopedic Surgery | Admitting: Physical Therapy

## 2017-07-23 DIAGNOSIS — G8929 Other chronic pain: Secondary | ICD-10-CM | POA: Diagnosis present

## 2017-07-23 DIAGNOSIS — M25552 Pain in left hip: Secondary | ICD-10-CM | POA: Insufficient documentation

## 2017-07-23 DIAGNOSIS — M6281 Muscle weakness (generalized): Secondary | ICD-10-CM | POA: Insufficient documentation

## 2017-07-23 DIAGNOSIS — M25551 Pain in right hip: Secondary | ICD-10-CM | POA: Diagnosis present

## 2017-07-23 DIAGNOSIS — R29898 Other symptoms and signs involving the musculoskeletal system: Secondary | ICD-10-CM | POA: Insufficient documentation

## 2017-07-23 DIAGNOSIS — M545 Low back pain, unspecified: Secondary | ICD-10-CM

## 2017-07-23 DIAGNOSIS — R262 Difficulty in walking, not elsewhere classified: Secondary | ICD-10-CM | POA: Insufficient documentation

## 2017-07-23 NOTE — Therapy (Signed)
[facility] [address] [place], [place], [address] Phone: [phone]   Fax:  [phone]  Physical Therapy Evaluation  Patient Details  Name: [name] MRN: [id number] Date of Birth: 01-12-1971 Referring Provider: Fuller Canada   Encounter Date: 07/23/2017      PT End of Session - 07/23/17 1821    Visit Number 1   Number of Visits 13   Date for PT Re-Evaluation 08/13/17   Authorization Type [employer] PPO Other (based on medical necessity)   Authorization Time Period 07/23/17 to 09/03/17   PT Start Time 1638   PT Stop Time 1716   PT Time Calculation (min) 38 min   Activity Tolerance Patient tolerated treatment well   Behavior During Therapy [facility] for tasks assessed/performed      Past Medical History:  Diagnosis Date  . Anxiety   . Depression   . HLD (hyperlipidemia) 02/24/2017  . Primary osteoarthritis of both knees 01/23/2017    Past Surgical History:  Procedure Laterality Date  . TUBAL LIGATION      There were no vitals filed for this visit.       Subjective Assessment - 07/23/17 1641    Subjective Paitent arrives with B back and B back pain; she has had quite a bit of imaging which showed OA in her hips and knees and back. She was given some ibuprofen, she is not sure if she'll be able to return to MD due to financial concerns about co-pays. She has trouble moving around, especially if she has been sitting for a long period of time. No falls recnetly, no close calls. Her left side bothers her more than the right. Some intermittent tingling in L knee.    Pertinent History OA of hips and knees, anxiety and depression    How long can you sit comfortably? depends on if it is hurting or not; she cannot cross her L leg over her R    How long can you stand comfortably? depends on the day    How long can you walk comfortably? depends on the day    Patient Stated Goals learn some exercises to manage pain   Currently in  Pain? No/denies  "very low right now; can be up to a 4/10 at worst"             [facility] PT Assessment - 07/23/17 0001      Assessment   Medical Diagnosis B back and B hip OA    Referring Provider Fuller Canada    Onset Date/Surgical Date --  6 months ago    Next MD Visit unsure    Prior Therapy none      Precautions   Precautions None     Restrictions   Weight Bearing Restrictions No     Balance Screen   Has the patient fallen in the past 6 months No   Has the patient had a decrease in activity level because of a fear of falling?  Yes   Is the patient reluctant to leave their home because of a fear of falling?  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    Lumbar -  Left Side Bdpec Asc Show LowBend WFL      Strength   Right Hip Flexion 4+/5   Right Hip Extension 2+/5   Right Hip ABduction 4/5   Left Hip Flexion 4-/5   Left Hip Extension 2/5   Left Hip ABduction 3-/5  limited due to pain    Right Knee Flexion 4/5   Right Knee Extension 4+/5   Left Knee Flexion 4-/5   Left Knee Extension 4/5   Right Ankle Dorsiflexion 4+/5   Left Ankle Dorsiflexion 4/5     Ambulation/Gait   Gait Comments antagic pattern, stiffness of hips and spine noted, hip ER noted B             Objective measurements completed on examination: See above findings.                  PT Education - 07/23/17 1834    Education provided Yes   Education Details prognosis, HEP, POC, exam findings; general explanation of avascular necrosis as well as relevance of following up for further examination and intervention/imaging with MDs given this possible disorder    Person(s) Educated Patient    Methods Explanation;Demonstration;Handout   Comprehension Verbalized understanding;Need further instruction;Returned demonstration          PT Short Term Goals - 07/23/17 1825      PT SHORT TERM GOAL #1   Title Patient to show at least a 50% improvement in lumbar and B hip mobility in order to reduce pain and improve mechanics    Time 3   Period Weeks   Status New   Target Date 08/13/17     PT SHORT TERM GOAL #2   Title Patient to demonsrate improved gait pattern, to include consistent heel-toe pattern, equal step/stance times, improved hip mobility and trunk rotation, elimination of antalgic gait in order to improve efficiency of mobility and to reduce pain    Time 3   Period Weeks   Status New     PT SHORT TERM GOAL #3   Title Patient to experience pain as being no more than 2/10 in order to improve QOL and tolerance to functional task performance    Time 3   Period Weeks   Status New     PT SHORT TERM GOAL #4   Title Patient to be compliant with correct performance of HEP, to be updated as able and tolerated    Time 1   Period Weeks   Status New   Target Date 07/30/17           PT Long Term Goals - 07/23/17 1828      PT LONG TERM GOAL #1   Title Patient to demonstrate improvement of at least 1 MMT grade in all tested groups in order to reduce pain and improve mobility    Time 6   Period Weeks   Status New   Target Date 09/03/17     PT LONG TERM GOAL #2   Title Patient to be able to reciprocally ascend/descend at least 4 standard height stairs with U railing, pain no more than 2/10, no unsteadiness in order to improve home and community access    Time 6   Period Weeks   Status New     PT LONG TERM GOAL #3   Title Patient to report she has been able to work through a full day with pain being no more than 2/10 to improve QOL and enhance functional task participation    Time 6  Period Weeks   Status New     PT LONG TERM GOAL #4   Title Patient to be able  to sit, stand, and walk for at least 60 consecutive minutes with pain being no more than 2/10 in order to improve QOL and functional task tolerance    Time 6   Period Weeks   Status New                Plan - 07/23/17 1822    Clinical Impression Statement Patient arrives with back and bilateral hip pain which has been present for about the past 6 months; she reports getting some imaging done, which showed extensive OA as well as possible avascular necrosis in her L hip.  She is hesitant about returning to the MD due to financial concerns. Examination reveals significant gait impairment, lumbar and hip stiffness, impaired functional muscle weakness including poor core strength, and reduced tolerance to functional task performance. Recommend skilled PT services to address functional deficits, reduce pain, and provide further education moving forward.    History and Personal Factors relevant to plan of care:  possible L hip avascular necrosis    Clinical Presentation Evolving   Clinical Presentation due to: biomechanical compensations and deconditioning, possible avascular necrosis    Clinical Decision Making Low   Rehab Potential Fair   Clinical Impairments Affecting Rehab Potential (+) motivated to participate in PT; (-) possible avascular necrosis L hip, anxious, hesitant to return to MD for followup due to financial concerns    PT Frequency 2x / week   PT Duration 6 weeks   PT Treatment/Interventions ADLs/Self Care Home Management;Biofeedback;Cryotherapy;Moist Heat;Gait training;DME Instruction;Stair training;Functional mobility training;Therapeutic activities;Therapeutic exercise;Balance training;Neuromuscular re-education;Patient/family education;Manual techniques;Passive range of motion;Dry needling;Energy conservation;Taping   PT Next Visit Plan review initial eval/goals, HEP; lumbar and hip mobility, functional strengthening, manual PRN, gait training    PT Home Exercise Plan eval:  SKTC, lumbar rotations, TA sets, bridges, brace supine marches    Recommended Other Services possible assistive device    Consulted and Agree with Plan of Care Patient      Patient will benefit from skilled therapeutic intervention in order to improve the following deficits and impairments:  Abnormal gait, Increased fascial restricitons, Improper body mechanics, Pain, Decreased coordination, Increased muscle spasms, Postural dysfunction, Decreased activity tolerance, Decreased range of motion, Decreased strength, Hypomobility, Difficulty walking, Impaired flexibility  Visit Diagnosis: Pain in left hip - Plan: PT plan of care cert/re-cert  Pain in right hip - Plan: PT plan of care cert/re-cert  Chronic low back pain without sciatica, unspecified back pain laterality - Plan: PT plan of care cert/re-cert  Muscle weakness (generalized) - Plan: PT plan of care cert/re-cert  Difficulty in walking, not elsewhere classified - Plan: PT plan of care cert/re-cert  Other symptoms and signs involving the musculoskeletal system - Plan: PT plan of care cert/re-cert     Problem List Patient Active Problem List   Diagnosis Date Noted  . Vitamin D deficiency 02/24/2017  . HLD (hyperlipidemia) 02/24/2017  . Morbid obesity (HCC) 01/23/2017  . Depression with anxiety 01/23/2017  . Primary osteoarthritis of both hips 01/23/2017  . Primary osteoarthritis of both knees 01/23/2017  . Simple hepatic cyst 01/23/2017    Nedra Hai PT, DPT [phone]  [facility] [address] [place], [place], [address] Phone: [phone]   Fax:  [phone]  Name: [name] MRN: [id number] Date of Birth: Apr 18, 1971

## 2017-07-23 NOTE — Patient Instructions (Signed)
   SINGLE KNEE TO CHEST STRETCH - SKTC  While lying on your back, use your hands and gently draw up a knee towards your chest.   Keep your other knee straight and lying on the ground.  You may use a towel or sheet to help pick up your leg like we did in clinic.  Repeat 5 times each side, twice a day.     Lumbar Rotations   Lying on your back with your knees bent, slowly drop your legs to one side and hold the stretch. Come back to the middle and switch sides. You should feel the stretch in your back on the opposite side that your legs are leaning.  Hold for a slow count of 5, then switch to the other side.  Repeat 5 times each side, twice a day.     Transverse Abdominus Activation  Lying on your back, pull your bellybutton into your spine.   Hold for 3 seconds and then relax.   Repeat 20 times, 4-5 times per day.    BRIDGING  While lying on your back with knees bent, tighten your lower abdominals, squeeze your buttocks and then raise your buttocks off the floor/bed as creating a "Bridge" with your body.  Repeat 10 times, twice a day.    BRACE SUPINE MARCHING  While lying on your back with your knees bent,  slowly raise up one foot a few inches and then set it back down.  Next, perform on your other leg.  Use your stomach muscles to keep your spine from moving.  Repeat 10 times each side, twice a day.

## 2017-07-25 ENCOUNTER — Ambulatory Visit: Payer: BLUE CROSS/BLUE SHIELD | Admitting: Family Medicine

## 2017-07-28 ENCOUNTER — Ambulatory Visit: Payer: BLUE CROSS/BLUE SHIELD | Admitting: Orthopedic Surgery

## 2017-07-29 ENCOUNTER — Encounter (HOSPITAL_COMMUNITY): Payer: Self-pay

## 2017-07-29 ENCOUNTER — Ambulatory Visit (HOSPITAL_COMMUNITY): Payer: BC Managed Care – PPO

## 2017-07-29 DIAGNOSIS — M25551 Pain in right hip: Secondary | ICD-10-CM

## 2017-07-29 DIAGNOSIS — M545 Low back pain, unspecified: Secondary | ICD-10-CM

## 2017-07-29 DIAGNOSIS — M25552 Pain in left hip: Secondary | ICD-10-CM

## 2017-07-29 DIAGNOSIS — R29898 Other symptoms and signs involving the musculoskeletal system: Secondary | ICD-10-CM

## 2017-07-29 DIAGNOSIS — G8929 Other chronic pain: Secondary | ICD-10-CM

## 2017-07-29 DIAGNOSIS — R262 Difficulty in walking, not elsewhere classified: Secondary | ICD-10-CM

## 2017-07-29 DIAGNOSIS — M6281 Muscle weakness (generalized): Secondary | ICD-10-CM

## 2017-07-29 NOTE — Therapy (Signed)
[facility] [address] [employer] [employer], [place], [address] Phone: [phone]   Fax:  [phone]  Physical Therapy Treatment  Patient Details  Name: [name] MRN: [id number] Date of Birth: 06/12/1971 Referring Provider: Fuller Canada   Encounter Date: 07/29/2017      PT End of Session - 07/29/17 1813    Visit Number 2   Number of Visits 13   Date for PT Re-Evaluation 08/13/17   Authorization Type BCBS PPO Other (based on medical necessity)   Authorization Time Period 07/23/17 to 09/03/17   PT Start Time 1732   PT Stop Time 1813   PT Time Calculation (min) 41 min   Activity Tolerance Patient tolerated treatment well   Behavior During Therapy [facility] for tasks assessed/performed      Past Medical History:  Diagnosis Date  . Anxiety   . Depression   . HLD (hyperlipidemia) 02/24/2017  . Primary osteoarthritis of both knees 01/23/2017    Past Surgical History:  Procedure Laterality Date  . TUBAL LIGATION      There were no vitals filed for this visit.      Subjective Assessment - 07/29/17 1732    Subjective Patient notes she has a little bit of stiffness but not hurting too bad. She notes it's been a long day.    Currently in Pain? No/denies                         [facility] Adult PT Treatment/Exercise - 07/29/17 0001      Lumbar Exercises: Standing   Heel Raises --   Heel Raises Limitations --   Wall Slides --     Lumbar Exercises: Supine   Ab Set 10 reps;5 seconds   Clam 10 reps  ab set   Bent Knee Raise 10 reps   Bent Knee Raise Limitations cue for form   Bridge 10 reps;3 seconds   Other Supine Lumbar Exercises Lumbar rotations x 10, SKTC x 10 bilateral      Knee/Hip Exercises: Standing   Heel Raises 10 reps   Hip Abduction 10 reps   Hip Extension 10 reps   Lateral Step Up 10 reps;Both;Step Height: 4"   Forward Step Up 5 reps;Both;Step Height: 4"   Step Down 10 reps;Both;Step Height: 2"   Wall Squat 10 reps   Wall Squat Limitations approx 45 degrees   Rocker Board 2 minutes   Rocker Board Limitations Lateral; forward/backward  cue for form   Gait Training Side step x 2 RT, backwards x 2 RT   Other Standing Knee Exercises tandem ambulation x 1 RT in // bars                  PT Short Term Goals - 07/23/17 1825      PT SHORT TERM GOAL #1   Title Patient to show at least a 50% improvement in lumbar and B hip mobility in order to reduce pain and improve mechanics    Time 3   Period Weeks   Status New   Target Date 08/13/17     PT SHORT TERM GOAL #2   Title Patient to demonsrate improved gait pattern, to include consistent heel-toe pattern, equal step/stance times, improved hip mobility and trunk rotation, elimination of antalgic gait in order to improve efficiency of mobility and to reduce pain    Time 3   Period Weeks   Status New  PT SHORT TERM GOAL #3   Title Patient to experience pain as being no more than 2/10 in order to improve QOL and tolerance to functional task performance    Time 3   Period Weeks   Status New     PT SHORT TERM GOAL #4   Title Patient to be compliant with correct performance of HEP, to be updated as able and tolerated    Time 1   Period Weeks   Status New   Target Date 07/30/17           PT Long Term Goals - 07/23/17 1828      PT LONG TERM GOAL #1   Title Patient to demonstrate improvement of at least 1 MMT grade in all tested groups in order to reduce pain and improve mobility    Time 6   Period Weeks   Status New   Target Date 09/03/17     PT LONG TERM GOAL #2   Title Patient to be able to reciprocally ascend/descend at least 4 standard height stairs with U railing, pain no more than 2/10, no unsteadiness in order to improve home and community access    Time 6   Period Weeks   Status New     PT LONG TERM GOAL #3   Title Patient to report she has been able to work through a full day with pain being no more  than 2/10 to improve QOL and enhance functional task participation    Time 6   Period Weeks   Status New     PT LONG TERM GOAL #4   Title Patient to be able to sit, stand, and walk for at least 60 consecutive minutes with pain being no more than 2/10 in order to improve QOL and functional task tolerance    Time 6   Period Weeks   Status New               Plan - 07/29/17 1814    Clinical Impression Statement Session included review and handout of initial evaluation with discussion of goals. Transitioned in review of HEP exercise with moderate verbal cueing for desired form. Session included core strengthening, core stabilization, hip strengthening, functional strengthening and dynamic balance exercises. She tolerated session well with noted decreased stiffness end of session.    Rehab Potential Fair   Clinical Impairments Affecting Rehab Potential (+) motivated to participate in PT; (-) possible avascular necrosis L hip, anxious, hesitant to return to MD for followup due to financial concerns    PT Frequency 2x / week   PT Duration 6 weeks   PT Treatment/Interventions ADLs/Self Care Home Management;Biofeedback;Cryotherapy;Moist Heat;Gait training;DME Instruction;Stair training;Functional mobility training;Therapeutic activities;Therapeutic exercise;Balance training;Neuromuscular re-education;Patient/family education;Manual techniques;Passive range of motion;Dry needling;Energy conservation;Taping   PT Next Visit Plan Review HEP form again; lumbar and hip mobility, functional strengthening, manual PRN, gait training    PT Home Exercise Plan eval: SKTC, lumbar rotations, TA sets, bridges, brace supine marches    Consulted and Agree with Plan of Care Patient      Patient will benefit from skilled therapeutic intervention in order to improve the following deficits and impairments:  Abnormal gait, Increased fascial restricitons, Improper body mechanics, Pain, Decreased coordination,  Increased muscle spasms, Postural dysfunction, Decreased activity tolerance, Decreased range of motion, Decreased strength, Hypomobility, Difficulty walking, Impaired flexibility  Visit Diagnosis: Pain in left hip  Pain in right hip  Chronic low back pain without sciatica, unspecified back pain laterality  Muscle  weakness (generalized)  Difficulty in walking, not elsewhere classified  Other symptoms and signs involving the musculoskeletal system     Problem List Patient Active Problem List   Diagnosis Date Noted  . Vitamin D deficiency 02/24/2017  . HLD (hyperlipidemia) 02/24/2017  . Morbid obesity (HCC) 01/23/2017  . Depression with anxiety 01/23/2017  . Primary osteoarthritis of both hips 01/23/2017  . Primary osteoarthritis of both knees 01/23/2017  . Simple hepatic cyst 01/23/2017   Candise Che PT, DPT 6:16 PM, 07/29/17 [phone]  [facility] [address] [place], [place], [address] Phone: [phone]   Fax:  [phone]  Name: [name] MRN: [id number] Date of Birth: [birth year]

## 2017-07-31 ENCOUNTER — Encounter (HOSPITAL_COMMUNITY): Payer: Self-pay

## 2017-07-31 ENCOUNTER — Ambulatory Visit (HOSPITAL_COMMUNITY): Payer: BC Managed Care – PPO

## 2017-07-31 DIAGNOSIS — M545 Low back pain, unspecified: Secondary | ICD-10-CM

## 2017-07-31 DIAGNOSIS — M25551 Pain in right hip: Secondary | ICD-10-CM

## 2017-07-31 DIAGNOSIS — R29898 Other symptoms and signs involving the musculoskeletal system: Secondary | ICD-10-CM

## 2017-07-31 DIAGNOSIS — G8929 Other chronic pain: Secondary | ICD-10-CM

## 2017-07-31 DIAGNOSIS — M25552 Pain in left hip: Secondary | ICD-10-CM | POA: Diagnosis not present

## 2017-07-31 DIAGNOSIS — M6281 Muscle weakness (generalized): Secondary | ICD-10-CM

## 2017-07-31 DIAGNOSIS — R262 Difficulty in walking, not elsewhere classified: Secondary | ICD-10-CM

## 2017-07-31 NOTE — Therapy (Signed)
[facility] [address] [place], [place], [address] Phone: [phone]   Fax:  [phone]  Physical Therapy Treatment  Patient Details  Name: [name] MRN: [id number] Date of Birth: 04-12-1971 Referring Provider: Fuller Canada   Encounter Date: 07/31/2017      PT End of Session - 07/31/17 1617    Visit Number 3   Number of Visits 13   Date for PT Re-Evaluation 08/13/17   Authorization Type BCBS PPO Other (based on medical necessity)   Authorization Time Period 07/23/17 to 09/03/17   PT Start Time 1606   PT Stop Time 1644   PT Time Calculation (min) 38 min   Activity Tolerance Patient tolerated treatment well;Patient limited by fatigue   Behavior During Therapy [facility] for tasks assessed/performed      Past Medical History:  Diagnosis Date  . Anxiety   . Depression   . HLD (hyperlipidemia) 02/24/2017  . Primary osteoarthritis of both knees 01/23/2017    Past Surgical History:  Procedure Laterality Date  . TUBAL LIGATION      There were no vitals filed for this visit.      Subjective Assessment - 07/31/17 1610    Subjective Pt stated she is stiff in lower back with no pain.  Does express some Lt hip pain and stiffness/ache.  Reports compliance wiht HEP daily.   Pertinent History OA of hips and knees, anxiety and depression    Patient Stated Goals learn some exercises to manage pain   Currently in Pain? Yes   Pain Score 3    Pain Location Leg   Pain Orientation Left   Pain Descriptors / Indicators Aching   Pain Type Chronic pain   Pain Onset More than a month ago   Pain Frequency Intermittent   Aggravating Factors  weight bearing, unsure   Pain Relieving Factors decreased stress                         [facility] Adult PT Treatment/Exercise - 07/31/17 0001      Bed Mobility   Bed Mobility Sit to Sidelying Left   Sit to Sidelying Left 5: Supervision   Sit to Sidelying Left Details (indicate  cue type and reason) Instructed proper bed mechanics with log roll     Lumbar Exercises: Stretches   Single Knee to Chest Stretch 2 reps;30 seconds   Single Knee to Chest Stretch Limitations towel assistance   Lower Trunk Rotation 10 seconds   Lower Trunk Rotation Limitations 10x 10" (Cueing for form)   Standing Extension 10 seconds     Lumbar Exercises: Standing   Functional Squats 10 reps   Functional Squats Limitations 3D hip excursion with cueing for form with squats   Other Standing Lumbar Exercises 2 min rocker board R/L with ab set   Other Standing Lumbar Exercises sidestep 2RT RTB     Lumbar Exercises: Supine   Clam 10 reps  cueing for form   Bent Knee Raise 10 reps   Bent Knee Raise Limitations cue for form   Bridge 10 reps;3 seconds   Other Supine Lumbar Exercises LTR 10x 10"                  PT Short Term Goals - 07/23/17 1825      PT SHORT TERM GOAL #1   Title Patient to show at least a 50% improvement in lumbar and B hip mobility in order  to reduce pain and improve mechanics    Time 3   Period Weeks   Status New   Target Date 08/13/17     PT SHORT TERM GOAL #2   Title Patient to demonsrate improved gait pattern, to include consistent heel-toe pattern, equal step/stance times, improved hip mobility and trunk rotation, elimination of antalgic gait in order to improve efficiency of mobility and to reduce pain    Time 3   Period Weeks   Status New     PT SHORT TERM GOAL #3   Title Patient to experience pain as being no more than 2/10 in order to improve QOL and tolerance to functional task performance    Time 3   Period Weeks   Status New     PT SHORT TERM GOAL #4   Title Patient to be compliant with correct performance of HEP, to be updated as able and tolerated    Time 1   Period Weeks   Status New   Target Date 07/30/17           PT Long Term Goals - 07/23/17 1828      PT LONG TERM GOAL #1   Title Patient to demonstrate improvement of  at least 1 MMT grade in all tested groups in order to reduce pain and improve mobility    Time 6   Period Weeks   Status New   Target Date 09/03/17     PT LONG TERM GOAL #2   Title Patient to be able to reciprocally ascend/descend at least 4 standard height stairs with U railing, pain no more than 2/10, no unsteadiness in order to improve home and community access    Time 6   Period Weeks   Status New     PT LONG TERM GOAL #3   Title Patient to report she has been able to work through a full day with pain being no more than 2/10 to improve QOL and enhance functional task participation    Time 6   Period Weeks   Status New     PT LONG TERM GOAL #4   Title Patient to be able to sit, stand, and walk for at least 60 consecutive minutes with pain being no more than 2/10 in order to improve QOL and functional task tolerance    Time 6   Period Weeks   Status New               Plan - 07/31/17 1636    Clinical Impression Statement Reviewed form with HEP with moderate cueing for stability and form.  Session focus on hip and lumbar mobility, core strengthening and functional strengtheing.  Educated benefits with bed mobilty with log roll for pain control.  Added 3D hip excursion to improve hip mobility.  Pt with tendency to ER Rt LE with increased cueing to neutralize with CKC.  EOs no reports of pain, does feel decrease stiffness.     Rehab Potential Fair   Clinical Impairments Affecting Rehab Potential (+) motivated to participate in PT; (-) possible avascular necrosis L hip, anxious, hesitant to return to MD for followup due to financial concerns    PT Frequency 2x / week   PT Duration 6 weeks   PT Treatment/Interventions ADLs/Self Care Home Management;Biofeedback;Cryotherapy;Moist Heat;Gait training;DME Instruction;Stair training;Functional mobility training;Therapeutic activities;Therapeutic exercise;Balance training;Neuromuscular re-education;Patient/family education;Manual  techniques;Passive range of motion;Dry needling;Energy conservation;Taping   PT Next Visit Plan Review HEP form again; lumbar and hip mobility, functional strengthening,  manual PRN, gait training    PT Home Exercise Plan eval: SKTC, lumbar rotations, TA sets, bridges, brace supine marches       Patient will benefit from skilled therapeutic intervention in order to improve the following deficits and impairments:  Abnormal gait, Increased fascial restricitons, Improper body mechanics, Pain, Decreased coordination, Increased muscle spasms, Postural dysfunction, Decreased activity tolerance, Decreased range of motion, Decreased strength, Hypomobility, Difficulty walking, Impaired flexibility  Visit Diagnosis: Pain in left hip  Pain in right hip  Chronic low back pain without sciatica, unspecified back pain laterality  Muscle weakness (generalized)  Difficulty in walking, not elsewhere classified  Other symptoms and signs involving the musculoskeletal system     Problem List Patient Active Problem List   Diagnosis Date Noted  . Vitamin D deficiency 02/24/2017  . HLD (hyperlipidemia) 02/24/2017  . Morbid obesity (HCC) 01/23/2017  . Depression with anxiety 01/23/2017  . Primary osteoarthritis of both hips 01/23/2017  . Primary osteoarthritis of both knees 01/23/2017  . Simple hepatic cyst 01/23/2017   Becky Saxasey Cockerham, LPTA; CBIS [phone]  Juel BurrowCockerham, Casey Jo 07/31/2017, 4:48 PM  [facility] [address] [place], [place], [address] Phone: [phone]   Fax:  [phone]  Name: Shawn RouteCarlene I Brass MRN: [id number] Date of Birth: November 20, 1970

## 2017-08-05 ENCOUNTER — Ambulatory Visit (HOSPITAL_COMMUNITY): Payer: BC Managed Care – PPO

## 2017-08-05 ENCOUNTER — Encounter (HOSPITAL_COMMUNITY): Payer: Self-pay

## 2017-08-05 DIAGNOSIS — M545 Low back pain, unspecified: Secondary | ICD-10-CM

## 2017-08-05 DIAGNOSIS — M25552 Pain in left hip: Secondary | ICD-10-CM | POA: Diagnosis not present

## 2017-08-05 DIAGNOSIS — M6281 Muscle weakness (generalized): Secondary | ICD-10-CM

## 2017-08-05 DIAGNOSIS — R262 Difficulty in walking, not elsewhere classified: Secondary | ICD-10-CM

## 2017-08-05 DIAGNOSIS — R29898 Other symptoms and signs involving the musculoskeletal system: Secondary | ICD-10-CM

## 2017-08-05 DIAGNOSIS — G8929 Other chronic pain: Secondary | ICD-10-CM

## 2017-08-05 DIAGNOSIS — M25551 Pain in right hip: Secondary | ICD-10-CM

## 2017-08-05 NOTE — Therapy (Signed)
[facility] [address] [place], [place], [address] Phone: [phone]   Fax:  [phone]  Physical Therapy Treatment  Patient Details  Name: [name] MRN: [id number] Date of Birth: [birth year] Referring Provider: Fuller Canada  Encounter Date: 08/05/2017      PT End of Session - 08/05/17 1608    Visit Number 4   Number of Visits 13   Date for PT Re-Evaluation 08/13/17   Authorization Type BCBS PPO Other (based on medical necessity)   Authorization Time Period 07/23/17 to 09/03/17   PT Start Time 1605   PT Stop Time 1648   PT Time Calculation (min) 43 min      Past Medical History:  Diagnosis Date  . Anxiety   . Depression   . HLD (hyperlipidemia) 02/24/2017  . Primary osteoarthritis of both knees 01/23/2017    Past Surgical History:  Procedure Laterality Date  . TUBAL LIGATION      There were no vitals filed for this visit.      Subjective Assessment - 08/05/17 1605    Subjective Pt stated no real pain, does have some stiffness following sitting for long periods of time (>10-15 min).     Pertinent History OA of hips and knees, anxiety and depression    Patient Stated Goals learn some exercises to manage pain   Currently in Pain? No/denies  stiffness in hips            [facility] PT Assessment - 08/05/17 0001      Assessment   Medical Diagnosis B back and B hip OA    Referring Provider Fuller Canada   Onset Date/Surgical Date --  6 months ago   Next MD Visit unsure    Prior Therapy none      Precautions   Precautions None     Observation/Other Assessments   Observations leg length Lt 1/4in longer, adviced to purchase heel lift                      [facility] Adult PT Treatment/Exercise - 08/05/17 0001      Lumbar Exercises: Stretches   Lower Trunk Rotation 10 seconds   Lower Trunk Rotation Limitations 10x 10" (Cueing for form)     Lumbar Exercises: Standing   Functional Squats  10 reps   Functional Squats Limitations 3D hip excursion with cueing for form with squats   Other Standing Lumbar Exercises 2 min rocker board R/L and A/P with ab set;  hip hike BLE 10x   Other Standing Lumbar Exercises sidestep 2RT RTB     Lumbar Exercises: Supine   Bent Knee Raise 10 reps   Bent Knee Raise Limitations with purple band pull downm   Bridge 10 reps;3 seconds   Other Supine Lumbar Exercises LTR 10x 10"     Knee/Hip Exercises: Standing   Lateral Step Up 15 reps;Step Height: 4";Hand Hold: 2   Forward Step Up Both;15 reps;Hand Hold: 1;Step Height: 6"   Rocker Board 2 minutes   Rocker Board Limitations Lateral; forward/backward   Gait Training 221ft with cueing for heel to toe mechanics                  PT Short Term Goals - 07/23/17 1825      PT SHORT TERM GOAL #1   Title Patient to show at least a 50% improvement in lumbar and B hip mobility in order to reduce pain and improve mechanics  Time 3   Period Weeks   Status New   Target Date 08/13/17     PT SHORT TERM GOAL #2   Title Patient to demonsrate improved gait pattern, to include consistent heel-toe pattern, equal step/stance times, improved hip mobility and trunk rotation, elimination of antalgic gait in order to improve efficiency of mobility and to reduce pain    Time 3   Period Weeks   Status New     PT SHORT TERM GOAL #3   Title Patient to experience pain as being no more than 2/10 in order to improve QOL and tolerance to functional task performance    Time 3   Period Weeks   Status New     PT SHORT TERM GOAL #4   Title Patient to be compliant with correct performance of HEP, to be updated as able and tolerated    Time 1   Period Weeks   Status New   Target Date 07/30/17           PT Long Term Goals - 07/23/17 1828      PT LONG TERM GOAL #1   Title Patient to demonstrate improvement of at least 1 MMT grade in all tested groups in order to reduce pain and improve mobility     Time 6   Period Weeks   Status New   Target Date 09/03/17     PT LONG TERM GOAL #2   Title Patient to be able to reciprocally ascend/descend at least 4 standard height stairs with U railing, pain no more than 2/10, no unsteadiness in order to improve home and community access    Time 6   Period Weeks   Status New     PT LONG TERM GOAL #3   Title Patient to report she has been able to work through a full day with pain being no more than 2/10 to improve QOL and enhance functional task participation    Time 6   Period Weeks   Status New     PT LONG TERM GOAL #4   Title Patient to be able to sit, stand, and walk for at least 60 consecutive minutes with pain being no more than 2/10 in order to improve QOL and functional task tolerance    Time 6   Period Weeks   Status New               Plan - 08/05/17 1639    Clinical Impression Statement Session focus with hip mobilty and functional strengthening.  Noted leg length discrepency with Lt LE 1/4in longer, pt educated on benefits with heel lift support to assist with pain control and improve gait mechanics.  Pt demonstrated improved mechancis with bed mobilty with no cueing required.  Pt able to complete proper form with majority of therex following initial demonstration, does continue to demonstrate excessive Rt LE ER with cueing to neutralize with CKC.     Rehab Potential Fair   Clinical Impairments Affecting Rehab Potential (+) motivated to participate in PT; (-) possible avascular necrosis L hip, anxious, hesitant to return to MD for followup due to financial concerns    PT Frequency 2x / week   PT Treatment/Interventions ADLs/Self Care Home Management;Biofeedback;Cryotherapy;Moist Heat;Gait training;DME Instruction;Stair training;Functional mobility training;Therapeutic activities;Therapeutic exercise;Balance training;Neuromuscular re-education;Patient/family education;Manual techniques;Passive range of motion;Dry needling;Energy  conservation;Taping   PT Next Visit Plan Continue lumbar and hip mobility, functional strengthening, manual PRN, gait training.  Next session assess SI alignment to address Rt  LE ER.        Patient will benefit from skilled therapeutic intervention in order to improve the following deficits and impairments:  Abnormal gait, Increased fascial restricitons, Improper body mechanics, Pain, Decreased coordination, Increased muscle spasms, Postural dysfunction, Decreased activity tolerance, Decreased range of motion, Decreased strength, Hypomobility, Difficulty walking, Impaired flexibility  Visit Diagnosis: Pain in left hip  Pain in right hip  Chronic low back pain without sciatica, unspecified back pain laterality  Muscle weakness (generalized)  Difficulty in walking, not elsewhere classified  Other symptoms and signs involving the musculoskeletal system     Problem List Patient Active Problem List   Diagnosis Date Noted  . Vitamin D deficiency 02/24/2017  . HLD (hyperlipidemia) 02/24/2017  . Morbid obesity (HCC) 01/23/2017  . Depression with anxiety 01/23/2017  . Primary osteoarthritis of both hips 01/23/2017  . Primary osteoarthritis of both knees 01/23/2017  . Simple hepatic cyst 01/23/2017    Juel BurrowCockerham, Jabbar Palmero Jo 08/05/2017, 4:51 PM  [facility] [address] [place], [place], [address] Phone: [phone]   Fax:  [phone]  Name: [name] MRN: [id number] Date of Birth: Dec 05, 1970

## 2017-08-07 ENCOUNTER — Encounter (HOSPITAL_COMMUNITY): Payer: Self-pay

## 2017-08-07 ENCOUNTER — Ambulatory Visit (HOSPITAL_COMMUNITY): Payer: BC Managed Care – PPO | Attending: Orthopedic Surgery

## 2017-08-07 DIAGNOSIS — G8929 Other chronic pain: Secondary | ICD-10-CM | POA: Insufficient documentation

## 2017-08-07 DIAGNOSIS — M25552 Pain in left hip: Secondary | ICD-10-CM | POA: Diagnosis not present

## 2017-08-07 DIAGNOSIS — R29898 Other symptoms and signs involving the musculoskeletal system: Secondary | ICD-10-CM | POA: Insufficient documentation

## 2017-08-07 DIAGNOSIS — R262 Difficulty in walking, not elsewhere classified: Secondary | ICD-10-CM | POA: Diagnosis present

## 2017-08-07 DIAGNOSIS — M545 Low back pain: Secondary | ICD-10-CM | POA: Diagnosis present

## 2017-08-07 DIAGNOSIS — M25551 Pain in right hip: Secondary | ICD-10-CM | POA: Diagnosis present

## 2017-08-07 DIAGNOSIS — M6281 Muscle weakness (generalized): Secondary | ICD-10-CM | POA: Diagnosis present

## 2017-08-07 NOTE — Therapy (Signed)
[place], [place], [address] Phone: [phone]   Fax:  [phone]  Physical Therapy Treatment  Patient Details  Name: [name] MRN: [id number] Date of Birth: 1971/09/20 Referring Provider: Arther Abbott  Encounter Date: 08/07/2017      PT End of Session - 08/07/17 1736    Visit Number 5   Number of Visits 13   Date for PT Re-Evaluation 08/13/17   Authorization Type BCBS PPO Other (based on medical necessity)   Authorization Time Period 07/23/17 to 09/03/17   PT Start Time 1650   PT Stop Time 1732   PT Time Calculation (min) 42 min   Activity Tolerance Patient tolerated treatment well;Patient limited by fatigue;No increased pain   Behavior During Therapy WFL for tasks assessed/performed      Past Medical History:  Diagnosis Date  . Anxiety   . Depression   . HLD (hyperlipidemia) 02/24/2017  . Primary osteoarthritis of both knees 01/23/2017    Past Surgical History:  Procedure Laterality Date  . TUBAL LIGATION      There were no vitals filed for this visit.      Subjective Assessment - 08/07/17 1653    Subjective Pt reports she felt good last session, reports no increased pain and had good night sleep.  Reports she has purchased heel support on [employer].  CUrrent pain scale 2/10 Lt hip   Pertinent History OA of hips and knees, anxiety and depression    Patient Stated Goals learn some exercises to manage pain   Currently in Pain? Yes   Pain Score 2    Pain Location Hip   Pain Orientation Left   Pain Descriptors / Indicators Aching;Sore   Pain Type Chronic pain   Pain Onset More than a month ago   Pain Frequency Intermittent   Aggravating Factors  weight bearing, unsure   Pain Relieving Factors decreased stress                         [facility] Adult PT Treatment/Exercise - 08/07/17 0001      Lumbar Exercises: Standing   Functional Squats 10 reps   Functional Squats  Limitations 3D hip excursion with cueing for form with squats   Other Standing Lumbar Exercises 2 min rocker board R/L and A/P with ab set;  hip hike BLE 10x   Other Standing Lumbar Exercises sidestep 2RT RTB     Lumbar Exercises: Supine   Bridge 10 reps;3 seconds     Manual Therapy   Manual Therapy Other (comment);Soft tissue mobilization   Manual therapy comments Manual complete separate rest of tx   Soft tissue mobilization Rt sidelying with pillow between knees then supine to anterior and lateral mm   Other Manual Therapy SI alingment assessed with Lt hip inflare, MEt to complete to improve ER Lt and reduce Rt ER                  PT Short Term Goals - 07/23/17 1825      PT SHORT TERM GOAL #1   Title Patient to show at least a 50% improvement in lumbar and B hip mobility in order to reduce pain and improve mechanics    Time 3   Period Weeks   Status New   Target Date 08/13/17     PT SHORT TERM GOAL #2   Title Patient to demonsrate improved gait pattern, to include consistent heel-toe  pattern, equal step/stance times, improved hip mobility and trunk rotation, elimination of antalgic gait in order to improve efficiency of mobility and to reduce pain    Time 3   Period Weeks   Status New     PT SHORT TERM GOAL #3   Title Patient to experience pain as being no more than 2/10 in order to improve QOL and tolerance to functional task performance    Time 3   Period Weeks   Status New     PT SHORT TERM GOAL #4   Title Patient to be compliant with correct performance of HEP, to be updated as able and tolerated    Time 1   Period Weeks   Status New   Target Date 07/30/17           PT Long Term Goals - 07/23/17 1828      PT LONG TERM GOAL #1   Title Patient to demonstrate improvement of at least 1 MMT grade in all tested groups in order to reduce pain and improve mobility    Time 6   Period Weeks   Status New   Target Date 09/03/17     PT LONG TERM GOAL #2    Title Patient to be able to reciprocally ascend/descend at least 4 standard height stairs with U railing, pain no more than 2/10, no unsteadiness in order to improve home and community access    Time 6   Period Weeks   Status New     PT LONG TERM GOAL #3   Title Patient to report she has been able to work through a full day with pain being no more than 2/10 to improve QOL and enhance functional task participation    Time 6   Period Weeks   Status New     PT LONG TERM GOAL #4   Title Patient to be able to sit, stand, and walk for at least 60 consecutive minutes with pain being no more than 2/10 in order to improve QOL and functional task tolerance    Time 6   Period Weeks   Status New               Plan - 08/07/17 1741    Clinical Impression Statement Began session assessing SI alignment with noted Lt SI inflare and decreased ER, MET to improve SI alignment.  Manual soft tissue mobilization complete to address muscule tightness around hip with reports of relief following.  Pt presents with improve hip alignment with gait following mET with no cueing to reduce Rt LE ER through session.  Therex focus on proximal strengthening, improved mechanics demonstrated with hip hikes.  No reoprts of pain at [facility].     Rehab Potential Fair   Clinical Impairments Affecting Rehab Potential (+) motivated to participate in PT; (-) possible avascular necrosis L hip, anxious, hesitant to return to MD for followup due to financial concerns    PT Frequency 2x / week   PT Duration 6 weeks   PT Treatment/Interventions ADLs/Self Care Home Management;Biofeedback;Cryotherapy;Moist Heat;Gait training;DME Instruction;Stair training;Functional mobility training;Therapeutic activities;Therapeutic exercise;Balance training;Neuromuscular re-education;Patient/family education;Manual techniques;Passive range of motion;Dry needling;Energy conservation;Taping   PT Next Visit Plan F/U wiht relief following this session with  manual.  Continue lumbar and hip mobility, functional strengthening, manual PRN, gait training.  Next session trail with hip flexion/extension during hip hike   PT Home Exercise Plan eval: SKTC, lumbar rotations, TA sets, bridges, brace supine marches  Patient will benefit from skilled therapeutic intervention in order to improve the following deficits and impairments:  Abnormal gait, Increased fascial restricitons, Improper body mechanics, Pain, Decreased coordination, Increased muscle spasms, Postural dysfunction, Decreased activity tolerance, Decreased range of motion, Decreased strength, Hypomobility, Difficulty walking, Impaired flexibility  Visit Diagnosis: Pain in left hip  Pain in right hip  Chronic low back pain without sciatica, unspecified back pain laterality  Muscle weakness (generalized)  Difficulty in walking, not elsewhere classified  Other symptoms and signs involving the musculoskeletal system     Problem List Patient Active Problem List   Diagnosis Date Noted  . Vitamin D deficiency 02/24/2017  . HLD (hyperlipidemia) 02/24/2017  . Morbid obesity ([place]) 01/23/2017  . Depression with anxiety 01/23/2017  . Primary osteoarthritis of both hips 01/23/2017  . Primary osteoarthritis of both knees 01/23/2017  . Simple hepatic cyst 01/23/2017    Ihor Austin, [place]; [place]  Aldona Lento 08/07/2017, 5:51 PM  [facility] [address] [place], [place], [address] Phone: [phone]   Fax:  [phone]  Name: [name] MRN: [id number] Date of Birth: 07/15/1971

## 2017-08-12 ENCOUNTER — Encounter (HOSPITAL_COMMUNITY): Payer: Self-pay | Admitting: Physical Therapy

## 2017-08-12 ENCOUNTER — Ambulatory Visit (HOSPITAL_COMMUNITY): Payer: BC Managed Care – PPO | Admitting: Physical Therapy

## 2017-08-12 DIAGNOSIS — M25552 Pain in left hip: Secondary | ICD-10-CM | POA: Diagnosis not present

## 2017-08-12 DIAGNOSIS — G8929 Other chronic pain: Secondary | ICD-10-CM

## 2017-08-12 DIAGNOSIS — M545 Low back pain, unspecified: Secondary | ICD-10-CM

## 2017-08-12 DIAGNOSIS — R29898 Other symptoms and signs involving the musculoskeletal system: Secondary | ICD-10-CM

## 2017-08-12 DIAGNOSIS — M25551 Pain in right hip: Secondary | ICD-10-CM

## 2017-08-12 DIAGNOSIS — M6281 Muscle weakness (generalized): Secondary | ICD-10-CM

## 2017-08-12 DIAGNOSIS — R262 Difficulty in walking, not elsewhere classified: Secondary | ICD-10-CM

## 2017-08-12 NOTE — Therapy (Signed)
[place], [place], [address] Phone: [phone]   Fax:  [phone]  Physical Therapy Treatment (Re-Assessment)  Patient Details  Name: [name] MRN: [id number] Date of Birth: 05-Aug-1971 Referring Provider: Arther Abbott   Encounter Date: 08/12/2017  PT End of Session - 08/12/17 1609    Visit Number  6    Number of Visits  13    Date for PT Re-Evaluation  09/03/17    Authorization Type  BCBS PPO Other (based on medical necessity)    Authorization Time Period  07/23/17 to 09/03/17    PT Start Time  1518    PT Stop Time  1600    PT Time Calculation (min)  42 min    Activity Tolerance  Patient tolerated treatment well    Behavior During Therapy  [facility] for tasks assessed/performed       Past Medical History:  Diagnosis Date  . Anxiety   . Depression   . HLD (hyperlipidemia) 02/24/2017  . Primary osteoarthritis of both knees 01/23/2017    Past Surgical History:  Procedure Laterality Date  . TUBAL LIGATION      There were no vitals filed for this visit.  Subjective Assessment - 08/12/17 1520    Subjective  Patient reports taht she has good days and bad days, on a bad day it can still be hard for her to get out of a chair. She states that continuing to reduce pain in her hip is a priority. She has been wearing the heel support for about 3 days; she is not sure if it is changing anything. Her pain is slowly getting better.     Pertinent History  OA of hips and knees, anxiety and depression     How long can you sit comfortably?  11/6- it depends, she still cannot cross her L LE over her R     How long can you stand comfortably?  11/6- depends on the day     How long can you walk comfortably?  11/6- depends on the day     Patient Stated Goals  learn some exercises to manage pain    Currently in Pain?  Yes    Pain Score  4     Pain Location  Hip    Pain Orientation  Left    Pain Descriptors /  Indicators  Aching;Sore         [facility] PT Assessment - 08/12/17 0001      AROM   Lumbar Flexion  mild limitation       Strength   Right Hip Flexion  5/5    Right Hip ABduction  4/5    Left Hip Flexion  3+/5    Left Hip ABduction  2/5 limited due to pain    limited due to pain    Right Knee Flexion  5/5    Right Knee Extension  5/5    Left Knee Flexion  4/5    Left Knee Extension  4/5    Right Ankle Dorsiflexion  5/5    Left Ankle Dorsiflexion  5/5      Palpation   Palpation comment  TTP distal ITB, reports proximal L hip pain but uanble to identify with palpation       Ambulation/Gait   Gait Comments  initial antalgic pattern, improves with extended gait, improved mobility during gait  [facility] Adult PT Treatment/Exercise - 08/12/17 0001      Lumbar Exercises: Standing   Other Standing Lumbar Exercises  forward lunges 4 inch box 1x10 B; forward step ups 6 inch box 1x10 forward and lateral     Other Standing Lumbar Exercises  hp hikes 1x15 B       Manual Therapy   Manual Therapy  Soft tissue mobilization    Manual therapy comments  Manual complete separate rest of tx    Soft tissue mobilization  L ITB/lateral thigh with ball assist              PT Education - 08/12/17 1608    Education provided  Yes    Education Details  progress towards goals, POC moving forward, continued to encourage patient to follow up with MD     Person(s) Educated  Patient    Methods  Explanation    Comprehension  Verbalized understanding       PT Short Term Goals - 08/12/17 1535      PT SHORT TERM GOAL #1   Title  Patient to show at least a 50% improvement in lumbar and B hip mobility in order to reduce pain and improve mechanics     Baseline  11/6- lumbar has improved, hip stiffness remains     Time  3    Period  Weeks    Status  Partially Met      PT SHORT TERM GOAL #2   Title  Patient to demonsrate improved gait pattern, to include consistent  heel-toe pattern, equal step/stance times, improved hip mobility and trunk rotation, elimination of antalgic gait in order to improve efficiency of mobility and to reduce pain     Baseline  11/6- initial antalgic gait with walking, improves with motion     Time  3    Period  Weeks    Status  Partially Met      PT SHORT TERM GOAL #3   Title  Patient to experience pain as being no more than 2/10 in order to improve QOL and tolerance to functional task performance     Baseline  11/6- 4/10 today, can get to "more than 10/10"     Time  3    Period  Weeks    Status  On-going      PT SHORT TERM GOAL #4   Title  Patient to be compliant with correct performance of HEP, to be updated as able and tolerated     Baseline  11/6- compliant     Time  1    Period  Weeks    Status  Achieved        PT Long Term Goals - 08/12/17 1536      PT LONG TERM GOAL #1   Title  Patient to demonstrate improvement of at least 1 MMT grade in all tested groups in order to reduce pain and improve mobility     Baseline  11/6- pain limited L LE     Time  6    Period  Weeks    Status  On-going      PT LONG TERM GOAL #2   Title  Patient to be able to reciprocally ascend/descend at least 4 standard height stairs with U railing, pain no more than 2/10, no unsteadiness in order to improve home and community access     Baseline  11/6- DNT    Time  6    Period  Weeks  Status  On-going      PT LONG TERM GOAL #3   Title  Patient to report she has been able to work through a full day with pain being no more than 2/10 to improve QOL and enhance functional task participation     Baseline  11/6- onoging     Time  6    Period  Weeks    Status  New      PT LONG TERM GOAL #4   Title  Patient to be able to sit, stand, and walk for at least 60 consecutive minutes with pain being no more than 2/10 in order to improve QOL and functional task tolerance     Baseline  11/6- ongoing     Time  6    Period  Weeks    Status   On-going            Plan - 08/12/17 1610    Clinical Impression Statement  Re-assessment performed today. Patient reports that she has noticed some QOL changes with skilled PT services, including improved gait pattern and pain; objective assessment confirms this, revealing improved hip and lumbar range, however continuing to demonstrate considerable pain limited functional weakness and gait deviation also likely due to pain. Of concern is potential avascular necrosis for which MDs have advised her to obtain more imaging/MD follow-up for, however patient has been unable to do so due to financial concerns; also note concerning occasional radiating of pain down to knee at this point. Discussed POC moving forward, also discussed possible implications of not having MD follow-up regarding possible avascular necrosis. Recommend continuation of original PT POC moving forward.    Rehab Potential  Fair    Clinical Impairments Affecting Rehab Potential  (+) motivated to participate in PT; (-) possible avascular necrosis L hip, anxious, hesitant to return to MD for followup due to financial concerns     PT Frequency  2x / week    PT Duration  3 weeks    PT Treatment/Interventions  ADLs/Self Care Home Management;Biofeedback;Cryotherapy;Moist Heat;Gait training;DME Instruction;Stair training;Functional mobility training;Therapeutic activities;Therapeutic exercise;Balance training;Neuromuscular re-education;Patient/family education;Manual techniques;Passive range of motion;Dry needling;Energy conservation;Taping    PT Next Visit Plan  continue manual L ITB/lateral thigh; continue functional strength, work on hip hike form. Work on squat form.      PT Home Exercise Plan  eval: SKTC, lumbar rotations, TA sets, bridges, brace supine marches     Consulted and Agree with Plan of Care  Patient       Patient will benefit from skilled therapeutic intervention in order to improve the following deficits and  impairments:  Abnormal gait, Increased fascial restricitons, Improper body mechanics, Pain, Decreased coordination, Increased muscle spasms, Postural dysfunction, Decreased activity tolerance, Decreased range of motion, Decreased strength, Hypomobility, Difficulty walking, Impaired flexibility  Visit Diagnosis: Pain in left hip  Pain in right hip  Chronic low back pain without sciatica, unspecified back pain laterality  Muscle weakness (generalized)  Difficulty in walking, not elsewhere classified  Other symptoms and signs involving the musculoskeletal system     Problem List Patient Active Problem List   Diagnosis Date Noted  . Vitamin D deficiency 02/24/2017  . HLD (hyperlipidemia) 02/24/2017  . Morbid obesity ([place]) 01/23/2017  . Depression with anxiety 01/23/2017  . Primary osteoarthritis of both hips 01/23/2017  . Primary osteoarthritis of both knees 01/23/2017  . Simple hepatic cyst 01/23/2017    Deniece Ree PT, DPT [facility]  [address] [place], [place], [address] Phone: [phone]   Fax:  [phone]  Name: [name] MRN: [id number] Date of Birth: 1970/11/28

## 2017-08-14 ENCOUNTER — Ambulatory Visit (HOSPITAL_COMMUNITY): Payer: BC Managed Care – PPO

## 2017-08-14 ENCOUNTER — Telehealth (HOSPITAL_COMMUNITY): Payer: Self-pay

## 2017-08-14 NOTE — Telephone Encounter (Signed)
Pt is not feeling well and did not sleep good, her leg did not feel right after her last tx when the leg was rubbed with a ball.

## 2017-08-19 ENCOUNTER — Ambulatory Visit (HOSPITAL_COMMUNITY): Payer: BC Managed Care – PPO

## 2017-08-19 ENCOUNTER — Encounter (HOSPITAL_COMMUNITY): Payer: Self-pay

## 2017-08-19 DIAGNOSIS — R29898 Other symptoms and signs involving the musculoskeletal system: Secondary | ICD-10-CM

## 2017-08-19 DIAGNOSIS — G8929 Other chronic pain: Secondary | ICD-10-CM

## 2017-08-19 DIAGNOSIS — M25552 Pain in left hip: Secondary | ICD-10-CM | POA: Diagnosis not present

## 2017-08-19 DIAGNOSIS — R262 Difficulty in walking, not elsewhere classified: Secondary | ICD-10-CM

## 2017-08-19 DIAGNOSIS — M545 Low back pain, unspecified: Secondary | ICD-10-CM

## 2017-08-19 DIAGNOSIS — M25551 Pain in right hip: Secondary | ICD-10-CM

## 2017-08-19 DIAGNOSIS — M6281 Muscle weakness (generalized): Secondary | ICD-10-CM

## 2017-08-19 NOTE — Therapy (Signed)
[place], [place], [address] Phone: [phone]   Fax:  [phone]  Physical Therapy Treatment  Patient Details  Name: [name] MRN: [id number] Date of Birth: 06-29-1971 Referring Provider: Arther Abbott   Encounter Date: 08/19/2017  PT End of Session - 08/19/17 1519    Visit Number  7    Number of Visits  13    Date for PT Re-Evaluation  09/03/17    Authorization Type  BCBS PPO Other (based on medical necessity)    Authorization Time Period  07/23/17 to 09/03/17    PT Start Time  1512    PT Stop Time  1558    PT Time Calculation (min)  46 min    Activity Tolerance  Patient tolerated treatment well    Behavior During Therapy  [facility] for tasks assessed/performed       Past Medical History:  Diagnosis Date  . Anxiety   . Depression   . HLD (hyperlipidemia) 02/24/2017  . Primary osteoarthritis of both knees 01/23/2017    Past Surgical History:  Procedure Laterality Date  . TUBAL LIGATION      There were no vitals filed for this visit.  Subjective Assessment - 08/19/17 1509    Subjective  Pt stated she is feeling okay today, no reports of pain.  Continues to ambulate with heel support; unsure if it is changing anything.  Feels mainly weakness in hips today.      Patient Stated Goals  learn some exercises to manage pain    Currently in Pain?  No/denies                      [facility] Adult PT Treatment/Exercise - 08/19/17 0001      Lumbar Exercises: Standing   Heel Raises  10 reps    Functional Squats  10 reps    Functional Squats Limitations  3D hip excursion with cueing for form with squats    Other Standing Lumbar Exercises  hp hikes 1x15 B; hip hike with flex/extend 10x      Lumbar Exercises: Seated   Sit to Stand  10 reps eccentric control      Lumbar Exercises: Supine   Bridge  10 reps;3 seconds RTB around knees      Lumbar Exercises: Sidelying   Hip Abduction  10  reps      Knee/Hip Exercises: Standing   Heel Raises  10 reps toe raises 10x    Hip Abduction  10 reps;Both RTB    Functional Squat  10 reps    SLS  Rt 15", Lt 24"    Other Standing Knee Exercises  sidestep with RTB 1RT      Manual Therapy   Manual Therapy  Soft tissue mobilization    Manual therapy comments  Manual complete separate rest of tx    Soft tissue mobilization  L ITB/lateral thigh                PT Short Term Goals - 08/12/17 1535      PT SHORT TERM GOAL #1   Title  Patient to show at least a 50% improvement in lumbar and B hip mobility in order to reduce pain and improve mechanics     Baseline  11/6- lumbar has improved, hip stiffness remains     Time  3    Period  Weeks    Status  Partially Met  PT SHORT TERM GOAL #2   Title  Patient to demonsrate improved gait pattern, to include consistent heel-toe pattern, equal step/stance times, improved hip mobility and trunk rotation, elimination of antalgic gait in order to improve efficiency of mobility and to reduce pain     Baseline  11/6- initial antalgic gait with walking, improves with motion     Time  3    Period  Weeks    Status  Partially Met      PT SHORT TERM GOAL #3   Title  Patient to experience pain as being no more than 2/10 in order to improve QOL and tolerance to functional task performance     Baseline  11/6- 4/10 today, can get to "more than 10/10"     Time  3    Period  Weeks    Status  On-going      PT SHORT TERM GOAL #4   Title  Patient to be compliant with correct performance of HEP, to be updated as able and tolerated     Baseline  11/6- compliant     Time  1    Period  Weeks    Status  Achieved        PT Long Term Goals - 08/12/17 1536      PT LONG TERM GOAL #1   Title  Patient to demonstrate improvement of at least 1 MMT grade in all tested groups in order to reduce pain and improve mobility     Baseline  11/6- pain limited L LE     Time  6    Period  Weeks    Status   On-going      PT LONG TERM GOAL #2   Title  Patient to be able to reciprocally ascend/descend at least 4 standard height stairs with U railing, pain no more than 2/10, no unsteadiness in order to improve home and community access     Baseline  11/6- DNT    Time  6    Period  Weeks    Status  On-going      PT LONG TERM GOAL #3   Title  Patient to report she has been able to work through a full day with pain being no more than 2/10 to improve QOL and enhance functional task participation     Baseline  11/6- onoging     Time  6    Period  Weeks    Status  New      PT LONG TERM GOAL #4   Title  Patient to be able to sit, stand, and walk for at least 60 consecutive minutes with pain being no more than 2/10 in order to improve QOL and functional task tolerance     Baseline  11/6- ongoing     Time  6    Period  Weeks    Status  On-going            Plan - 08/19/17 1559    Clinical Impression Statement  Pt continues to demonstrate significant hip weakness wiht gait mechanics and functional tasks.  Session focus on proximal strengthening.  Pt limited by fatigue due to weakness wiht seated rest breaks required and cueing to improve form with tasks for increased ease.  No reports of pain through session, was limited by fatigue.      Rehab Potential  Fair    Clinical Impairments Affecting Rehab Potential  (+) motivated to participate in PT; (-) possible avascular  necrosis L hip, anxious, hesitant to return to MD for followup due to financial concerns     PT Frequency  2x / week    PT Duration  3 weeks    PT Treatment/Interventions  ADLs/Self Care Home Management;Biofeedback;Cryotherapy;Moist Heat;Gait training;DME Instruction;Stair training;Functional mobility training;Therapeutic activities;Therapeutic exercise;Balance training;Neuromuscular re-education;Patient/family education;Manual techniques;Passive range of motion;Dry needling;Energy conservation;Taping    PT Next Visit Plan  continue  functional strengthening, hip hike form.  Resume step ups for strengtheing next session.  Manual to Lt lateral thigh/ITB PRN.       Patient will benefit from skilled therapeutic intervention in order to improve the following deficits and impairments:  Abnormal gait, Increased fascial restricitons, Improper body mechanics, Pain, Decreased coordination, Increased muscle spasms, Postural dysfunction, Decreased activity tolerance, Decreased range of motion, Decreased strength, Hypomobility, Difficulty walking, Impaired flexibility  Visit Diagnosis: Pain in left hip  Pain in right hip  Chronic low back pain without sciatica, unspecified back pain laterality  Muscle weakness (generalized)  Difficulty in walking, not elsewhere classified  Other symptoms and signs involving the musculoskeletal system     Problem List Patient Active Problem List   Diagnosis Date Noted  . Vitamin D deficiency 02/24/2017  . HLD (hyperlipidemia) 02/24/2017  . Morbid obesity ([place]) 01/23/2017  . Depression with anxiety 01/23/2017  . Primary osteoarthritis of both hips 01/23/2017  . Primary osteoarthritis of both knees 01/23/2017  . Simple hepatic cyst 01/23/2017   Ihor Austin, LPTA; CBIS [phone]  Aldona Lento 08/19/2017, 6:46 PM  [facility] [address] [place], [place], [address] Phone: [phone]   Fax:  [phone]  Name: BARBETTE MCGLAUN MRN: [id number] Date of Birth: September 20, 1971

## 2017-08-21 ENCOUNTER — Encounter (HOSPITAL_COMMUNITY): Payer: Self-pay

## 2017-08-21 ENCOUNTER — Ambulatory Visit (HOSPITAL_COMMUNITY): Payer: BC Managed Care – PPO

## 2017-08-21 DIAGNOSIS — M6281 Muscle weakness (generalized): Secondary | ICD-10-CM

## 2017-08-21 DIAGNOSIS — R262 Difficulty in walking, not elsewhere classified: Secondary | ICD-10-CM

## 2017-08-21 DIAGNOSIS — M545 Low back pain, unspecified: Secondary | ICD-10-CM

## 2017-08-21 DIAGNOSIS — R29898 Other symptoms and signs involving the musculoskeletal system: Secondary | ICD-10-CM

## 2017-08-21 DIAGNOSIS — M25552 Pain in left hip: Secondary | ICD-10-CM | POA: Diagnosis not present

## 2017-08-21 DIAGNOSIS — G8929 Other chronic pain: Secondary | ICD-10-CM

## 2017-08-21 DIAGNOSIS — M25551 Pain in right hip: Secondary | ICD-10-CM

## 2017-08-21 NOTE — Patient Instructions (Signed)
Small Ball Marching Supine    Lie supine with small ball under tailbone, knees flexed. Raise one leg a few inches. Hold briefly, return and raise other leg. Keep hips stationary. Do 2 sets of 10 repetitions.  Copyright  VHI. All rights reserved.

## 2017-08-21 NOTE — Therapy (Signed)
[facility] [address] [place], [place], [address] Phone: [phone]   Fax:  [phone]  Physical Therapy Treatment  Patient Details  Name: [name] MRN: [id number] Date of Birth: 09/11/1971 Referring Provider: Stanley Harrison   Encounter Date: 08/21/2017  PT End of Session - 08/21/17 1516    Visit Number  8    Number of Visits  13    Date for PT Re-Evaluation  09/03/17    Authorization Type  BCBS PPO Other (based on medical necessity)    Authorization Time Period  07/23/17 to 09/03/17    PT Start Time  1506    PT Stop Time  1550    PT Time Calculation (min)  44 min    Activity Tolerance  Patient tolerated treatment well    Behavior During Therapy  WFL for tasks assessed/performed       Past Medical History:  Diagnosis Date  . Anxiety   . Depression   . HLD (hyperlipidemia) 02/24/2017  . Primary osteoarthritis of both knees 01/23/2017    Past Surgical History:  Procedure Laterality Date  . TUBAL LIGATION      There were no vitals filed for this visit.  Subjective Assessment - 08/21/17 1505    Subjective  Pt stated she has increased pain following a couple hours last sessoin, increased pain from Lt hip to knee (ITB region) pain scale 5/10 today.  Feels the weather may play a part in pain.  Unsure if manual helps or not.      Pertinent History  OA of hips and knees, anxiety and depression     Patient Stated Goals  learn some exercises to manage pain    Currently in Pain?  Yes    Pain Score  5     Pain Location  Hip    Pain Orientation  Left    Pain Descriptors / Indicators  Aching    Pain Type  Chronic pain    Pain Onset  More than a month ago    Pain Frequency  Intermittent    Aggravating Factors   weight bearing, unsure    Pain Relieving Factors  decreased stress                      [facility] Adult PT Treatment/Exercise - 08/21/17 0001      Lumbar Exercises: Stretches   Lower Trunk  Rotation Limitations  10x 10" (Cueing for form)    ITB Stretch  3 reps;30 seconds 2 reps beside steps; 1 set passive supine       Lumbar Exercises: Standing   Heel Raises  10 reps    Heel Raises Limitations  Toe raises       Lumbar Exercises: Supine   Bent Knee Raise  10 reps    Bridge  10 reps;3 seconds    Straight Leg Raise  Limitations bent knee raise, extend knee, bend and down    Other Supine Lumbar Exercises  LTR 10x 10"      Knee/Hip Exercises: Standing   Heel Raises  10 reps toe raises 10x    Hip Abduction  15 reps;Knee straight;Limitations    Abduction Limitations  with RTB 3" holds with cueing for form    Lateral Step Up  15 reps;Step Height: 4";Hand Hold: 2    Forward Step Up  Both;15 reps;Hand Hold: 1;Step Height: 6"    Functional Squat  10 reps;Limitations    Functional Squat   Limitations  3D hip excursion    Other Standing Knee Exercises  sidestep with RTB 2RT               PT Short Term Goals - 08/12/17 1535      PT SHORT TERM GOAL #1   Title  Patient to show at least a 50% improvement in lumbar and B hip mobility in order to reduce pain and improve mechanics     Baseline  11/6- lumbar has improved, hip stiffness remains     Time  3    Period  Weeks    Status  Partially Met      PT SHORT TERM GOAL #2   Title  Patient to demonsrate improved gait pattern, to include consistent heel-toe pattern, equal step/stance times, improved hip mobility and trunk rotation, elimination of antalgic gait in order to improve efficiency of mobility and to reduce pain     Baseline  11/6- initial antalgic gait with walking, improves with motion     Time  3    Period  Weeks    Status  Partially Met      PT SHORT TERM GOAL #3   Title  Patient to experience pain as being no more than 2/10 in order to improve QOL and tolerance to functional task performance     Baseline  11/6- 4/10 today, can get to "more than 10/10"     Time  3    Period  Weeks    Status  On-going      PT  SHORT TERM GOAL #4   Title  Patient to be compliant with correct performance of HEP, to be updated as able and tolerated     Baseline  11/6- compliant     Time  1    Period  Weeks    Status  Achieved        PT Long Term Goals - 08/12/17 1536      PT LONG TERM GOAL #1   Title  Patient to demonstrate improvement of at least 1 MMT grade in all tested groups in order to reduce pain and improve mobility     Baseline  11/6- pain limited L LE     Time  6    Period  Weeks    Status  On-going      PT LONG TERM GOAL #2   Title  Patient to be able to reciprocally ascend/descend at least 4 standard height stairs with U railing, pain no more than 2/10, no unsteadiness in order to improve home and community access     Baseline  11/6- DNT    Time  6    Period  Weeks    Status  On-going      PT LONG TERM GOAL #3   Title  Patient to report she has been able to work through a full day with pain being no more than 2/10 to improve QOL and enhance functional task participation     Baseline  11/6- onoging     Time  6    Period  Weeks    Status  New      PT LONG TERM GOAL #4   Title  Patient to be able to sit, stand, and walk for at least 60 consecutive minutes with pain being no more than 2/10 in order to improve QOL and functional task tolerance     Baseline  11/6- ongoing     Time  6      Period  Weeks    Status  On-going            Plan - 08/21/17 1555    Clinical Impression Statement  Pt continues to demonstrate significant hip weakness wiht gait mechanics and functional tasks.  Session focus on functional strengthening with addition of ITB stretches for pain control.  EOS pt limited by fatigue, reports pain reduced to 2/10.      Rehab Potential  Fair    Clinical Impairments Affecting Rehab Potential  (+) motivated to participate in PT; (-) possible avascular necrosis L hip, anxious, hesitant to return to MD for followup due to financial concerns     PT Frequency  2x / week    PT  Duration  3 weeks    PT Treatment/Interventions  ADLs/Self Care Home Management;Biofeedback;Cryotherapy;Moist Heat;Gait training;DME Instruction;Stair training;Functional mobility training;Therapeutic activities;Therapeutic exercise;Balance training;Neuromuscular re-education;Patient/family education;Manual techniques;Passive range of motion;Dry needling;Energy conservation;Taping    PT Next Visit Plan  continue functional strengthening, hip hike form.  Resume step ups for strengtheing next session.  Manual to Lt lateral thigh/ITB PRN.    PT Home Exercise Plan  eval: SKTC, lumbar rotations, TA sets, bridges, brace supine marches; 3D hip excursion; supine marching       Patient will benefit from skilled therapeutic intervention in order to improve the following deficits and impairments:  Abnormal gait, Increased fascial restricitons, Improper body mechanics, Pain, Decreased coordination, Increased muscle spasms, Postural dysfunction, Decreased activity tolerance, Decreased range of motion, Decreased strength, Hypomobility, Difficulty walking, Impaired flexibility  Visit Diagnosis: Pain in left hip  Pain in right hip  Chronic low back pain without sciatica, unspecified back pain laterality  Muscle weakness (generalized)  Difficulty in walking, not elsewhere classified  Other symptoms and signs involving the musculoskeletal system     Problem List Patient Active Problem List   Diagnosis Date Noted  . Vitamin D deficiency 02/24/2017  . HLD (hyperlipidemia) 02/24/2017  . Morbid obesity (HCC) 01/23/2017  . Depression with anxiety 01/23/2017  . Primary osteoarthritis of both hips 01/23/2017  . Primary osteoarthritis of both knees 01/23/2017  . Simple hepatic cyst 01/23/2017   Casey Cockerham, LPTA; CBIS [phone]  Cockerham, Casey Jo 08/21/2017, 4:02 PM  [facility] [address] [place], [place], [address] Phone: [phone]   Fax:   [phone]  Name: [name] MRN: [id number] Date of Birth: 11/28/1970   

## 2017-08-25 ENCOUNTER — Ambulatory Visit (HOSPITAL_COMMUNITY): Payer: BC Managed Care – PPO | Admitting: Physical Therapy

## 2017-08-25 ENCOUNTER — Encounter (HOSPITAL_COMMUNITY): Payer: Self-pay | Admitting: Physical Therapy

## 2017-08-25 DIAGNOSIS — G8929 Other chronic pain: Secondary | ICD-10-CM

## 2017-08-25 DIAGNOSIS — M25551 Pain in right hip: Secondary | ICD-10-CM

## 2017-08-25 DIAGNOSIS — R262 Difficulty in walking, not elsewhere classified: Secondary | ICD-10-CM

## 2017-08-25 DIAGNOSIS — R29898 Other symptoms and signs involving the musculoskeletal system: Secondary | ICD-10-CM

## 2017-08-25 DIAGNOSIS — M545 Low back pain, unspecified: Secondary | ICD-10-CM

## 2017-08-25 DIAGNOSIS — M6281 Muscle weakness (generalized): Secondary | ICD-10-CM

## 2017-08-25 DIAGNOSIS — M25552 Pain in left hip: Secondary | ICD-10-CM | POA: Diagnosis not present

## 2017-08-25 NOTE — Therapy (Signed)
[place], [place], [address] Phone: [phone]   Fax:  [phone]  Physical Therapy Treatment  Patient Details  Name: [name] MRN: [id number] Date of Birth: 04/07/1971 Referring Provider: Arther Abbott   Encounter Date: 08/25/2017  PT End of Session - 08/25/17 1618    Visit Number  9    Number of Visits  13    Date for PT Re-Evaluation  09/03/17    Authorization Type  BCBS PPO Other (based on medical necessity)    Authorization Time Period  07/23/17 to 09/03/17    PT Start Time  1517    PT Stop Time  1556    PT Time Calculation (min)  39 min    Activity Tolerance  Patient tolerated treatment well    Behavior During Therapy  [facility] for tasks assessed/performed       Past Medical History:  Diagnosis Date  . Anxiety   . Depression   . HLD (hyperlipidemia) 02/24/2017  . Primary osteoarthritis of both knees 01/23/2017    Past Surgical History:  Procedure Laterality Date  . TUBAL LIGATION      There were no vitals filed for this visit.  Subjective Assessment - 08/25/17 1520    Subjective  patient arrives stating she is doing OK, her leg is acting up a little bit today; she still feels the strengthening helps the most compared to everything else    Pertinent History  OA of hips and knees, anxiety and depression     Patient Stated Goals  learn some exercises to manage pain    Currently in Pain?  Yes    Pain Score  3     Pain Location  Hip    Pain Orientation  Left                      [facility] Adult PT Treatment/Exercise - 08/25/17 0001      Lumbar Exercises: Standing   Heel Raises  10 reps;15 reps 10 U heel raises, 15 toe raises incliine     Forward Lunge  15 reps 2 inch box    Wall Slides  10 reps cues for form     Other Standing Lumbar Exercises  forward and lateral step ups x15 B 6 inch box; 3 way hip holds 1x10 B cues for form      Other Standing Lumbar Exercises  hip  hikes no swing 1x15; hip hikes wit swing 1x10; hip hike circles 1x5       Lumbar Exercises: Seated   Other Seated Lumbar Exercises  attempted sit to stand with weighted yellow ball; uanble to hip pain/catching L       Lumbar Exercises: Supine   Clam  15 reps red band     Bent Knee Raise  15 reps B but not alternating sides     Bridge  15 reps      Lumbar Exercises: Sidelying   Hip Abduction  10 reps mod assist for form              PT Education - 08/25/17 1618    Education provided  No       PT Short Term Goals - 08/12/17 1535      PT SHORT TERM GOAL #1   Title  Patient to show at least a 50% improvement in lumbar and B hip mobility in order to reduce pain and improve mechanics  Baseline  11/6- lumbar has improved, hip stiffness remains     Time  3    Period  Weeks    Status  Partially Met      PT SHORT TERM GOAL #2   Title  Patient to demonsrate improved gait pattern, to include consistent heel-toe pattern, equal step/stance times, improved hip mobility and trunk rotation, elimination of antalgic gait in order to improve efficiency of mobility and to reduce pain     Baseline  11/6- initial antalgic gait with walking, improves with motion     Time  3    Period  Weeks    Status  Partially Met      PT SHORT TERM GOAL #3   Title  Patient to experience pain as being no more than 2/10 in order to improve QOL and tolerance to functional task performance     Baseline  11/6- 4/10 today, can get to "more than 10/10"     Time  3    Period  Weeks    Status  On-going      PT SHORT TERM GOAL #4   Title  Patient to be compliant with correct performance of HEP, to be updated as able and tolerated     Baseline  11/6- compliant     Time  1    Period  Weeks    Status  Achieved        PT Long Term Goals - 08/12/17 1536      PT LONG TERM GOAL #1   Title  Patient to demonstrate improvement of at least 1 MMT grade in all tested groups in order to reduce pain and improve  mobility     Baseline  11/6- pain limited L LE     Time  6    Period  Weeks    Status  On-going      PT LONG TERM GOAL #2   Title  Patient to be able to reciprocally ascend/descend at least 4 standard height stairs with U railing, pain no more than 2/10, no unsteadiness in order to improve home and community access     Baseline  11/6- DNT    Time  6    Period  Weeks    Status  On-going      PT LONG TERM GOAL #3   Title  Patient to report she has been able to work through a full day with pain being no more than 2/10 to improve QOL and enhance functional task participation     Baseline  11/6- onoging     Time  6    Period  Weeks    Status  New      PT LONG TERM GOAL #4   Title  Patient to be able to sit, stand, and walk for at least 60 consecutive minutes with pain being no more than 2/10 in order to improve QOL and functional task tolerance     Baseline  11/6- ongoing     Time  6    Period  Weeks    Status  On-going            Plan - 08/25/17 1619    Clinical Impression Statement  Continued with functional strengthening today, avoided manual as patient reports that this does not seem to be assisting in reducing her pain. Reintroduced step ups back into program, also worked on advanced hip hike exercise and hip hold exercises to promote proximal strengthening and stability this  session as well. Patient continues to have some pain going from L hip to knee intermittently.     Rehab Potential  Fair    Clinical Impairments Affecting Rehab Potential  (+) motivated to participate in PT; (-) possible avascular necrosis L hip, anxious, hesitant to return to MD for followup due to financial concerns     PT Frequency  2x / week    PT Duration  3 weeks    PT Treatment/Interventions  ADLs/Self Care Home Management;Biofeedback;Cryotherapy;Moist Heat;Gait training;DME Instruction;Stair training;Functional mobility training;Therapeutic activities;Therapeutic exercise;Balance  training;Neuromuscular re-education;Patient/family education;Manual techniques;Passive range of motion;Dry needling;Energy conservation;Taping    PT Next Visit Plan  focus on functional strength primarily     PT Home Exercise Plan  eval: SKTC, lumbar rotations, TA sets, bridges, brace supine marches; 3D hip excursion; supine marching    Consulted and Agree with Plan of Care  Patient       Patient will benefit from skilled therapeutic intervention in order to improve the following deficits and impairments:  Abnormal gait, Increased fascial restricitons, Improper body mechanics, Pain, Decreased coordination, Increased muscle spasms, Postural dysfunction, Decreased activity tolerance, Decreased range of motion, Decreased strength, Hypomobility, Difficulty walking, Impaired flexibility  Visit Diagnosis: Pain in left hip  Pain in right hip  Chronic low back pain without sciatica, unspecified back pain laterality  Muscle weakness (generalized)  Difficulty in walking, not elsewhere classified  Other symptoms and signs involving the musculoskeletal system     Problem List Patient Active Problem List   Diagnosis Date Noted  . Vitamin D deficiency 02/24/2017  . HLD (hyperlipidemia) 02/24/2017  . Morbid obesity ([place]) 01/23/2017  . Depression with anxiety 01/23/2017  . Primary osteoarthritis of both hips 01/23/2017  . Primary osteoarthritis of both knees 01/23/2017  . Simple hepatic cyst 01/23/2017    Deniece Ree PT, DPT, CBIS  Supplemental Physical Therapist [place], [place], [address] Phone: [phone]   Fax:  [phone]  Name: [name] MRN: [id number] Date of Birth: 1971-04-21

## 2017-08-27 ENCOUNTER — Encounter (HOSPITAL_COMMUNITY): Payer: Self-pay | Admitting: Physical Therapy

## 2017-08-27 ENCOUNTER — Ambulatory Visit (HOSPITAL_COMMUNITY): Payer: BC Managed Care – PPO | Admitting: Physical Therapy

## 2017-08-27 DIAGNOSIS — M545 Low back pain, unspecified: Secondary | ICD-10-CM

## 2017-08-27 DIAGNOSIS — M25552 Pain in left hip: Secondary | ICD-10-CM | POA: Diagnosis not present

## 2017-08-27 DIAGNOSIS — G8929 Other chronic pain: Secondary | ICD-10-CM

## 2017-08-27 DIAGNOSIS — M6281 Muscle weakness (generalized): Secondary | ICD-10-CM

## 2017-08-27 DIAGNOSIS — R262 Difficulty in walking, not elsewhere classified: Secondary | ICD-10-CM

## 2017-08-27 DIAGNOSIS — M25551 Pain in right hip: Secondary | ICD-10-CM

## 2017-08-27 DIAGNOSIS — R29898 Other symptoms and signs involving the musculoskeletal system: Secondary | ICD-10-CM

## 2017-08-27 NOTE — Therapy (Signed)
[place], [place], [address] Phone: [phone]   Fax:  [phone]  Physical Therapy Treatment  Patient Details  Name: [name] MRN: [id number] Date of Birth: 1971-02-21 Referring Provider: Arther Abbott   Encounter Date: 08/27/2017  PT End of Session - 08/27/17 1601    Visit Number  10    Number of Visits  13    Date for PT Re-Evaluation  09/03/17    Authorization Type  BCBS PPO Other (based on medical necessity)    Authorization Time Period  07/23/17 to 09/03/17    PT Start Time  1519    PT Stop Time  1558    PT Time Calculation (min)  39 min    Activity Tolerance  Patient tolerated treatment well    Behavior During Therapy  [facility] for tasks assessed/performed       Past Medical History:  Diagnosis Date  . Anxiety   . Depression   . HLD (hyperlipidemia) 02/24/2017  . Primary osteoarthritis of both knees 01/23/2017    Past Surgical History:  Procedure Laterality Date  . TUBAL LIGATION      There were no vitals filed for this visit.  Subjective Assessment - 08/27/17 1604    Subjective  Patient arrives stating she was quite sore after last session but she enjoyed it and felt challenged, strength continues to be her main concern     Patient Stated Goals  learn some exercises to manage pain    Currently in Pain?  Yes    Pain Score  2     Pain Location  Hip    Pain Orientation  Left                      [facility] Adult PT Treatment/Exercise - 08/27/17 0001      Lumbar Exercises: Standing   Heel Raises  15 reps heel and toe     Functional Squats  10 reps    Functional Squats Limitations  reverse chair squats  partial depth, cues for form     Forward Lunge  20 reps 2 inch box     Side Lunge  5 reps 6  inch box mod tactile cues/assist to control foot inversio    Other Standing Lumbar Exercises  forward step ups 8 inch box 1x10 U HHA     Other Standing Lumbar Exercises  3 way  hip holds 1x10 B; rockerboaard AP and latearl x2 minutes       Lumbar Exercises: Supine   Bent Knee Raise  15 reps L then R LE, not alternating     Bridge  10 reps 2 sets     Other Supine Lumbar Exercises  supine hip ABD with red TB 1x10       Lumbar Exercises: Sidelying   Clam  5 reps no resistance       Lumbar Exercises: Prone   Other Prone Lumbar Exercises  hip extension with knee bent 1x10 B              PT Education - 08/27/17 1601    Education provided  No       PT Short Term Goals - 08/12/17 1535      PT SHORT TERM GOAL #1   Title  Patient to show at least a 50% improvement in lumbar and B hip mobility in order to reduce pain and improve mechanics     Baseline  11/6- lumbar has improved, hip stiffness remains     Time  3    Period  Weeks    Status  Partially Met      PT SHORT TERM GOAL #2   Title  Patient to demonsrate improved gait pattern, to include consistent heel-toe pattern, equal step/stance times, improved hip mobility and trunk rotation, elimination of antalgic gait in order to improve efficiency of mobility and to reduce pain     Baseline  11/6- initial antalgic gait with walking, improves with motion     Time  3    Period  Weeks    Status  Partially Met      PT SHORT TERM GOAL #3   Title  Patient to experience pain as being no more than 2/10 in order to improve QOL and tolerance to functional task performance     Baseline  11/6- 4/10 today, can get to "more than 10/10"     Time  3    Period  Weeks    Status  On-going      PT SHORT TERM GOAL #4   Title  Patient to be compliant with correct performance of HEP, to be updated as able and tolerated     Baseline  11/6- compliant     Time  1    Period  Weeks    Status  Achieved        PT Long Term Goals - 08/12/17 1536      PT LONG TERM GOAL #1   Title  Patient to demonstrate improvement of at least 1 MMT grade in all tested groups in order to reduce pain and improve mobility     Baseline   11/6- pain limited L LE     Time  6    Period  Weeks    Status  On-going      PT LONG TERM GOAL #2   Title  Patient to be able to reciprocally ascend/descend at least 4 standard height stairs with U railing, pain no more than 2/10, no unsteadiness in order to improve home and community access     Baseline  11/6- DNT    Time  6    Period  Weeks    Status  On-going      PT LONG TERM GOAL #3   Title  Patient to report she has been able to work through a full day with pain being no more than 2/10 to improve QOL and enhance functional task participation     Baseline  11/6- onoging     Time  6    Period  Weeks    Status  New      PT LONG TERM GOAL #4   Title  Patient to be able to sit, stand, and walk for at least 60 consecutive minutes with pain being no more than 2/10 in order to improve QOL and functional task tolerance     Baseline  11/6- ongoing     Time  6    Period  Weeks    Status  On-going            Plan - 08/27/17 1602    Clinical Impression Statement  Continued with functional strength focus today, introduced reverse chair squats today for form (not for depth) for activation of key musculature this session. Patient continues to demonstrate considerable weakness, especially in hip abductor musculature, and was fatigued by today's session. Audible knee joint noise noted, however patient reports  this is not painful during exercises/activities this session.     Rehab Potential  Fair    Clinical Impairments Affecting Rehab Potential  (+) motivated to participate in PT; (-) possible avascular necrosis L hip, anxious, hesitant to return to MD for followup due to financial concerns     PT Frequency  2x / week    PT Duration  3 weeks    PT Treatment/Interventions  ADLs/Self Care Home Management;Biofeedback;Cryotherapy;Moist Heat;Gait training;DME Instruction;Stair training;Functional mobility training;Therapeutic activities;Therapeutic exercise;Balance training;Neuromuscular  re-education;Patient/family education;Manual techniques;Passive range of motion;Dry needling;Energy conservation;Taping    PT Next Visit Plan  focus on functional strength primarily     PT Home Exercise Plan  eval: SKTC, lumbar rotations, TA sets, bridges, brace supine marches; 3D hip excursion; supine marching    Consulted and Agree with Plan of Care  Patient       Patient will benefit from skilled therapeutic intervention in order to improve the following deficits and impairments:  Abnormal gait, Increased fascial restricitons, Improper body mechanics, Pain, Decreased coordination, Increased muscle spasms, Postural dysfunction, Decreased activity tolerance, Decreased range of motion, Decreased strength, Hypomobility, Difficulty walking, Impaired flexibility  Visit Diagnosis: Pain in left hip  Pain in right hip  Chronic low back pain without sciatica, unspecified back pain laterality  Muscle weakness (generalized)  Difficulty in walking, not elsewhere classified  Other symptoms and signs involving the musculoskeletal system     Problem List Patient Active Problem List   Diagnosis Date Noted  . Vitamin D deficiency 02/24/2017  . HLD (hyperlipidemia) 02/24/2017  . Morbid obesity ([place]) 01/23/2017  . Depression with anxiety 01/23/2017  . Primary osteoarthritis of both hips 01/23/2017  . Primary osteoarthritis of both knees 01/23/2017  . Simple hepatic cyst 01/23/2017    Deniece Ree PT, DPT, CBIS  Supplemental Physical Therapist [place], [place], [address] Phone: [phone]   Fax:  [phone]  Name: TIARI ANDRINGA MRN: [id number] Date of Birth: 10-Sep-1971

## 2017-09-01 ENCOUNTER — Ambulatory Visit (HOSPITAL_COMMUNITY): Payer: BC Managed Care – PPO | Admitting: Physical Therapy

## 2017-09-01 DIAGNOSIS — R29898 Other symptoms and signs involving the musculoskeletal system: Secondary | ICD-10-CM

## 2017-09-01 DIAGNOSIS — M545 Low back pain, unspecified: Secondary | ICD-10-CM

## 2017-09-01 DIAGNOSIS — R262 Difficulty in walking, not elsewhere classified: Secondary | ICD-10-CM

## 2017-09-01 DIAGNOSIS — M6281 Muscle weakness (generalized): Secondary | ICD-10-CM

## 2017-09-01 DIAGNOSIS — M25551 Pain in right hip: Secondary | ICD-10-CM

## 2017-09-01 DIAGNOSIS — M25552 Pain in left hip: Secondary | ICD-10-CM

## 2017-09-01 DIAGNOSIS — G8929 Other chronic pain: Secondary | ICD-10-CM

## 2017-09-01 NOTE — Therapy (Signed)
[place], [place], [address] Phone: [phone]   Fax:  [phone]  Physical Therapy Treatment  Patient Details  Name: [name] MRN: [id number] Date of Birth: [birth year] Referring Provider: Arther Abbott   Encounter Date: 09/01/2017  PT End of Session - 09/01/17 1601    Visit Number  11    Number of Visits  13    Date for PT Re-Evaluation  09/03/17    Authorization Type  BC[employer] PPO Other (based on medical necessity)    Authorization Time Period  07/23/17 to 09/03/17    PT Start Time  1520    PT Stop Time  1600    PT Time Calculation (min)  40 min    Activity Tolerance  Patient tolerated treatment well    Behavior During Therapy  [facility] for tasks assessed/performed       Past Medical History:  Diagnosis Date  . Anxiety   . Depression   . HLD (hyperlipidemia) 02/24/2017  . Primary osteoarthritis of both knees 01/23/2017    Past Surgical History:  Procedure Laterality Date  . TUBAL LIGATION      There were no vitals filed for this visit.  Subjective Assessment - 09/01/17 1525    Subjective  NO pain currently.    Currently in Pain?  No/denies                      [facility] Adult PT Treatment/Exercise - 09/01/17 0001      Lumbar Exercises: Standing   Heel Raises  20 reps    Heel Raises Limitations  Toe raises     Functional Squats  10 reps    Functional Squats Limitations  reverse chair squats     Forward Lunge  20 reps;Limitations onto 4" step    Forward Lunge Limitations  onto 4" step no UE's    Side Lunge  20 reps;Limitations onto 4" step    Side Lunge Limitations  onto 4" step no UE's    Other Standing Lumbar Exercises  forward step ups 8 inch box 1x10, lateral step ups 4" 10 reps, forward step downs 4" 10 reps    Other Standing Lumbar Exercises  3 way hip holds 1x10 B; rockerboaard AP and latearl x2 minutes       Lumbar Exercises: Supine   Bent Knee Raise  15 reps    Bridge  15 reps      Lumbar Exercises: Sidelying   Clam  10 reps    Hip Abduction  10 reps      Lumbar Exercises: Prone   Other Prone Lumbar Exercises  hip extension with knee bent 1x10 B                PT Short Term Goals - 08/12/17 1535      PT SHORT TERM GOAL #1   Title  Patient to show at least a 50% improvement in lumbar and B hip mobility in order to reduce pain and improve mechanics     Baseline  11/6- lumbar has improved, hip stiffness remains     Time  3    Period  Weeks    Status  Partially Met      PT SHORT TERM GOAL #2   Title  Patient to demonsrate improved gait pattern, to include consistent heel-toe pattern, equal step/stance times, improved hip mobility and trunk rotation, elimination of antalgic gait in order to improve efficiency of  mobility and to reduce pain     Baseline  11/6- initial antalgic gait with walking, improves with motion     Time  3    Period  Weeks    Status  Partially Met      PT SHORT TERM GOAL #3   Title  Patient to experience pain as being no more than 2/10 in order to improve QOL and tolerance to functional task performance     Baseline  11/6- 4/10 today, can get to "more than 10/10"     Time  3    Period  Weeks    Status  On-going      PT SHORT TERM GOAL #4   Title  Patient to be compliant with correct performance of HEP, to be updated as able and tolerated     Baseline  11/6- compliant     Time  1    Period  Weeks    Status  Achieved        PT Long Term Goals - 08/12/17 1536      PT LONG TERM GOAL #1   Title  Patient to demonstrate improvement of at least 1 MMT grade in all tested groups in order to reduce pain and improve mobility     Baseline  11/6- pain limited L LE     Time  6    Period  Weeks    Status  On-going      PT LONG TERM GOAL #2   Title  Patient to be able to reciprocally ascend/descend at least 4 standard height stairs with U railing, pain no more than 2/10, no unsteadiness in order to improve home  and community access     Baseline  11/6- DNT    Time  6    Period  Weeks    Status  On-going      PT LONG TERM GOAL #3   Title  Patient to report she has been able to work through a full day with pain being no more than 2/10 to improve QOL and enhance functional task participation     Baseline  11/6- onoging     Time  6    Period  Weeks    Status  New      PT LONG TERM GOAL #4   Title  Patient to be able to sit, stand, and walk for at least 60 consecutive minutes with pain being no more than 2/10 in order to improve QOL and functional task tolerance     Baseline  11/6- ongoing     Time  6    Period  Weeks    Status  On-going            Plan - 09/01/17 1601    Clinical Impression Statement  cotinued with functional strengthening.  Added in lateral and forward step downs this session with cues for form.  Pt with extremely weak glute musculature as noted with extra assist to manual correct to isolate musculature correctly.  Able to increase most reps today.  Pt without any other concerns or issues this session.     Rehab Potential  Fair    Clinical Impairments Affecting Rehab Potential  (+) motivated to participate in PT; (-) possible avascular necrosis L hip, anxious, hesitant to return to MD for followup due to financial concerns     PT Frequency  2x / week    PT Duration  3 weeks    PT Treatment/Interventions  ADLs/Self Care Home Management;Biofeedback;Cryotherapy;Moist Heat;Gait training;DME Instruction;Stair training;Functional mobility training;Therapeutic activities;Therapeutic exercise;Balance training;Neuromuscular re-education;Patient/family education;Manual techniques;Passive range of motion;Dry needling;Energy conservation;Taping    PT Next Visit Plan  focus on functional strength primarily.  Attempt sit to stands again next session.      PT Home Exercise Plan  eval: SKTC, lumbar rotations, TA sets, bridges, brace supine marches; 3D hip excursion; supine marching     Consulted and Agree with Plan of Care  Patient       Patient will benefit from skilled therapeutic intervention in order to improve the following deficits and impairments:  Abnormal gait, Increased fascial restricitons, Improper body mechanics, Pain, Decreased coordination, Increased muscle spasms, Postural dysfunction, Decreased activity tolerance, Decreased range of motion, Decreased strength, Hypomobility, Difficulty walking, Impaired flexibility  Visit Diagnosis: Pain in left hip  Pain in right hip  Chronic low back pain without sciatica, unspecified back pain laterality  Muscle weakness (generalized)  Other symptoms and signs involving the musculoskeletal system  Difficulty in walking, not elsewhere classified     Problem List Patient Active Problem List   Diagnosis Date Noted  . Vitamin D deficiency 02/24/2017  . HLD (hyperlipidemia) 02/24/2017  . Morbid obesity ([place]) 01/23/2017  . Depression with anxiety 01/23/2017  . Primary osteoarthritis of both hips 01/23/2017  . Primary osteoarthritis of both knees 01/23/2017  . Simple hepatic cyst 01/23/2017   Teena Irani, PTA/CLT [phone]  Teena Irani 09/01/2017, 4:04 PM  [place] [address] [place], [place], [address] Phone: [phone]   Fax:  [phone]  Name: [name] MRN: [id number] Date of Birth: 01-Jan-1971

## 2017-09-03 ENCOUNTER — Encounter (HOSPITAL_COMMUNITY): Payer: Self-pay | Admitting: Physical Therapy

## 2017-09-03 ENCOUNTER — Ambulatory Visit (HOSPITAL_COMMUNITY): Payer: BC Managed Care – PPO | Admitting: Physical Therapy

## 2017-09-03 DIAGNOSIS — M545 Low back pain: Secondary | ICD-10-CM

## 2017-09-03 DIAGNOSIS — M25551 Pain in right hip: Secondary | ICD-10-CM

## 2017-09-03 DIAGNOSIS — G8929 Other chronic pain: Secondary | ICD-10-CM

## 2017-09-03 DIAGNOSIS — M25552 Pain in left hip: Secondary | ICD-10-CM | POA: Diagnosis not present

## 2017-09-03 DIAGNOSIS — R262 Difficulty in walking, not elsewhere classified: Secondary | ICD-10-CM

## 2017-09-03 DIAGNOSIS — R29898 Other symptoms and signs involving the musculoskeletal system: Secondary | ICD-10-CM

## 2017-09-03 DIAGNOSIS — M6281 Muscle weakness (generalized): Secondary | ICD-10-CM

## 2017-09-03 NOTE — Patient Instructions (Signed)
     Isometric Hip Abduction on Wall  Standing next to wall, pick up leg closer to wall and push the side of your knee into the wall. Relax and bring leg back down before next rep.  You may also do this exercise with your leg straight as we did in clinic.  Repeat 10-15 times with 2-3 second holds, 1-2 times per day.

## 2017-09-03 NOTE — Therapy (Signed)
[place], [place], [address] Phone: [phone]   Fax:  [phone]  Physical Therapy Treatment (Re-Assessment)  Patient Details  Name: [name] MRN: [id number] Date of Birth: 03/26/1971 Referring Provider: Arther Abbott    Encounter Date: 09/03/2017  PT End of Session - 09/03/17 1604    Visit Number  12    Number of Visits  18    Date for PT Re-Evaluation  09/24/17    Authorization Type  BCBS PPO Other (based on medical necessity)    Authorization Time Period  07/23/17 to 08/05/12; recert done 14/38   PT Start Time  1518    PT Stop Time  1557    PT Time Calculation (min)  39 min    Activity Tolerance  Patient tolerated treatment well    Behavior During Therapy  [facility] for tasks assessed/performed       Past Medical History:  Diagnosis Date  . Anxiety   . Depression   . HLD (hyperlipidemia) 02/24/2017  . Primary osteoarthritis of both knees 01/23/2017    Past Surgical History:  Procedure Laterality Date  . TUBAL LIGATION      There were no vitals filed for this visit.  Subjective Assessment - 09/03/17 1521    Subjective  Paitent arrives stating she felt good after last session, she has not been hurting as much; she feels she has progressed quite a bit over the past couple weeks especially pain and strength wise. It is still hard for her to get started at first when standing up, however it depends on how long she is sitting- this is getting better.     Pertinent History  OA of hips and knees, anxiety and depression     How long can you sit comfortably?  11/28- unlimited, still cannot cross L LE over R due to pain     How long can you stand comfortably?  11/28- depends on day, but getting better     How long can you walk comfortably?  11/28- depends on day, but getting better     Patient Stated Goals  learn some exercises to manage pain    Currently in Pain?  Yes    Pain Score  3     Pain  Location  Knee    Pain Orientation  Left    Pain Descriptors / Indicators  Aching    Pain Type  Acute pain    Pain Radiating Towards  little bit around lateral side of knee     Pain Onset  More than a month ago    Pain Frequency  Intermittent    Aggravating Factors   nothing     Pain Relieving Factors  exercises     Effect of Pain on Daily Activities  mild          [facility] PT Assessment - 09/03/17 0001      Assessment   Medical Diagnosis  B back and B hip OA     Referring Provider  Arther Abbott     Onset Date/Surgical Date  -- 6 months ago     Next MD Visit  unsure     Prior Therapy  none       Precautions   Precautions  None      Balance Screen   Has the patient fallen in the past 6 months  No    Has the patient had a decrease in activity level because of  a fear of falling?   No    Is the patient reluctant to leave their home because of a fear of falling?   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       AROM   Lumbar Flexion  mild limitation     Lumbar Extension  WNL     Lumbar - Right Side Bend  mild limitation     Lumbar - Left Side Bend  mild limitation       Strength   Right Hip Flexion  5/5    Right Hip Extension  4-/5    Right Hip ABduction  4+/5    Left Hip Flexion  4/5    Left Hip Extension  4-/5    Left Hip ABduction  2+/5    Right Knee Extension  5/5    Left Knee Extension  5/5                  [facility] Adult PT Treatment/Exercise - 09/03/17 0001      Lumbar Exercises: Standing   Other Standing Lumbar Exercises  forward step ups 1x15 B 6 inch box     Other Standing Lumbar Exercises  isometric hip ABD 1x10 L; 3 way hip holds 1x10 B       Lumbar Exercises: Supine   Clam  15 reps red TB     Bent Knee Raise  15 reps    Bent Knee Raise  Limitations  red TB     Bridge  Other (comment);10 reps red TB with clamshell       Lumbar Exercises: Prone   Other Prone Lumbar Exercises  hip extension with knee bent 1x15 B              PT Education - 09/03/17 1602    Education provided  Yes    Education Details  progress with skilled PT services, POC moving forward, HEP update     Person(s) Educated  Patient    Methods  Explanation;Demonstration;Handout    Comprehension  Verbalized understanding;Returned demonstration       PT Short Term Goals - 09/03/17 1532      PT SHORT TERM GOAL #1   Title  Patient to show at least a 50% improvement in lumbar and B hip mobility in order to reduce pain and improve mechanics     Baseline  11/28- stiff but functional     Time  3    Period  Weeks    Status  Partially Met      PT SHORT TERM GOAL #2   Title  Patient to demonsrate improved gait pattern, to include consistent heel-toe pattern, equal step/stance times, improved hip mobility and trunk rotation, elimination of antalgic gait in order to improve efficiency of mobility and to reduce pain     Time  3    Period  Weeks    Status  Achieved      PT SHORT TERM GOAL #3   Title  Patient to experience pain as being no more than 2/10 in order to improve QOL and tolerance to functional task performance     Baseline  11/28- met as long as she keeps up with HEP     Time  3    Period  Weeks  Status  Achieved      PT SHORT TERM GOAL #4   Title  Patient to be compliant with correct performance of HEP, to be updated as able and tolerated     Time  1    Period  Weeks    Status  Achieved        PT Long Term Goals - 09/03/17 1534      PT LONG TERM GOAL #1   Title  Patient to demonstrate improvement of at least 1 MMT grade in all tested groups in order to reduce pain and improve mobility     Time  6    Period  Weeks    Status  Partially Met      PT LONG TERM GOAL #2   Title  Patient to be able to reciprocally ascend/descend at  least 4 standard height stairs with U railing, pain no more than 2/10, no unsteadiness in order to improve home and community access     Baseline  11/28- can do it with railing limited by weakness     Time  6    Period  Weeks    Status  Achieved      PT LONG TERM GOAL #3   Title  Patient to report she has been able to work through a full day with pain being no more than 2/10 to improve QOL and enhance functional task participation     Baseline  11/28- continues to take a  pain pill for work, unsure     Time  6    Period  Weeks    Status  On-going      PT LONG TERM GOAL #4   Title  Patient to be able to sit, stand, and walk for at least 60 consecutive minutes with pain being no more than 2/10 in order to improve QOL and functional task tolerance     Baseline  11/28- black friday she was out most of the day, has not paid a lot of attention to it since     Time  6    Period  Weeks    Status  Partially Met            Plan - 09/03/17 1605    Clinical Impression Statement  Re-assessment performed today. Patient continue to be making excellent progress with skilled PT services, and reports significant increases with subjective QOL and pain scale ratings. She has met more of her objective goals, and appears to be doing much better overall- however, note that she continues to display severe muscle weakness that is likely contributing to her remaining impairments and unmet goals at this time. Of some concern is patient's ongoing inability to cross her L LE over her R- however note that patient has been progressing well with skilled PT services and continues to improve globally. Recommend continuation of skilled PT services to continue progressing towards goals yet unmet.     Rehab Potential  Fair    Clinical Impairments Affecting Rehab Potential  (+) motivated to participate in PT; (-) possible avascular necrosis L hip, anxious, hesitant to return to MD for followup due to financial concerns      PT Frequency  2x / week    PT Duration  3 weeks    PT Treatment/Interventions  ADLs/Self Care Home Management;Biofeedback;Cryotherapy;Moist Heat;Gait training;DME Instruction;Stair training;Functional mobility training;Therapeutic activities;Therapeutic exercise;Balance training;Neuromuscular re-education;Patient/family education;Manual techniques;Passive range of motion;Dry needling;Energy conservation;Taping    PT Next Visit Plan  focus on  functional strength primarily.  Attempt sit to stands again next session.      PT Home Exercise Plan  eval: SKTC, lumbar rotations, TA sets, bridges, brace supine marches; 3D hip excursion; supine marching 11/28- isometric standing hip ABD     Consulted and Agree with Plan of Care  Patient       Patient will benefit from skilled therapeutic intervention in order to improve the following deficits and impairments:  Abnormal gait, Increased fascial restricitons, Improper body mechanics, Pain, Decreased coordination, Increased muscle spasms, Postural dysfunction, Decreased activity tolerance, Decreased range of motion, Decreased strength, Hypomobility, Difficulty walking, Impaired flexibility  Visit Diagnosis: Pain in left hip - Plan: PT plan of care cert/re-cert  Pain in right hip - Plan: PT plan of care cert/re-cert  Chronic low back pain without sciatica, unspecified back pain laterality - Plan: PT plan of care cert/re-cert  Muscle weakness (generalized) - Plan: PT plan of care cert/re-cert  Other symptoms and signs involving the musculoskeletal system - Plan: PT plan of care cert/re-cert  Difficulty in walking, not elsewhere classified - Plan: PT plan of care cert/re-cert     Problem List Patient Active Problem List   Diagnosis Date Noted  . Vitamin D deficiency 02/24/2017  . HLD (hyperlipidemia) 02/24/2017  . Morbid obesity ([place]) 01/23/2017  . Depression with anxiety 01/23/2017  . Primary osteoarthritis of both hips 01/23/2017  . Primary  osteoarthritis of both knees 01/23/2017  . Simple hepatic cyst 01/23/2017    Deniece Ree PT, DPT, CBIS  Supplemental Physical Therapist [place], [place], [address] Phone: [phone]   Fax:  [phone]  Name: [name] MRN: [id number] Date of Birth: [birth year]

## 2017-09-10 ENCOUNTER — Ambulatory Visit (HOSPITAL_COMMUNITY): Payer: BC Managed Care – PPO

## 2017-09-11 ENCOUNTER — Ambulatory Visit (HOSPITAL_COMMUNITY): Payer: BC Managed Care – PPO | Attending: Orthopedic Surgery

## 2017-09-11 DIAGNOSIS — R262 Difficulty in walking, not elsewhere classified: Secondary | ICD-10-CM

## 2017-09-11 DIAGNOSIS — M25552 Pain in left hip: Secondary | ICD-10-CM | POA: Diagnosis present

## 2017-09-11 DIAGNOSIS — R29898 Other symptoms and signs involving the musculoskeletal system: Secondary | ICD-10-CM | POA: Diagnosis present

## 2017-09-11 DIAGNOSIS — G8929 Other chronic pain: Secondary | ICD-10-CM | POA: Diagnosis present

## 2017-09-11 DIAGNOSIS — M545 Low back pain, unspecified: Secondary | ICD-10-CM

## 2017-09-11 DIAGNOSIS — M6281 Muscle weakness (generalized): Secondary | ICD-10-CM | POA: Diagnosis present

## 2017-09-11 DIAGNOSIS — M25551 Pain in right hip: Secondary | ICD-10-CM | POA: Diagnosis present

## 2017-09-11 NOTE — Therapy (Signed)
[place], [place], [address] Phone: [phone]   Fax:  [phone]  Physical Therapy Treatment  Patient Details  Name: [name] MRN: [id number] Date of Birth: 1971-08-07 Referring Provider: Arther Abbott    Encounter Date: 09/11/2017  PT End of Session - 09/11/17 1639    Visit Number  13    Number of Visits  18    Date for PT Re-Evaluation  09/24/17    Authorization Type  BCBS PPO Other (based on medical necessity)    Authorization Time Period  07/23/17 to 09/03/17    PT Start Time  1646    PT Stop Time  1732    PT Time Calculation (min)  46 min    Activity Tolerance  Patient tolerated treatment well    Behavior During Therapy  [facility] for tasks assessed/performed       Past Medical History:  Diagnosis Date  . Anxiety   . Depression   . HLD (hyperlipidemia) 02/24/2017  . Primary osteoarthritis of both knees 01/23/2017    Past Surgical History:  Procedure Laterality Date  . TUBAL LIGATION      There were no vitals filed for this visit.  Subjective Assessment - 09/11/17 1638    Subjective  Patient has felt really bad the last couple of days in "total pain". 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 Today she is feeling a bit better and only has a 4/10 pain. She states she slept better last night than she has in a several days. Patient reports it is still difficult to get up and down from laying in her bed and it hurts to have her left leg straight.    Pertinent History  OA of hips and knees, anxiety and depression     Patient Stated Goals  learn some exercises to manage pain    Currently in Pain?  Yes    Pain Score  4     Pain Location  Hip    Pain Orientation  Left    Pain Descriptors / Indicators  Aching    Pain Type  Chronic pain    Pain Radiating Towards  knee is still botehring her too    Pain Onset  More than a month ago    Pain  Frequency  Constant         [facility] PT Assessment - 09/11/17 0001      AROM   Right/Left Hip  Left    Right Hip Flexion  90    Left Hip Flexion  85    Left Hip External Rotation   15    Left Hip Internal Rotation   15    Left Hip ABduction  20       [facility] Adult PT Treatment/Exercise - 09/11/17 0001      Transfers   Transfers  Supine to Sit;Sit to Supine    Supine to Sit  6: Modified independent (Device/Increase time)    Sit to Supine  6: Modified independent (Device/Increase time)    Transfer Cueing  pateint requires cues for sequencing trunk and LE movements to perform log roll technique, tactile cues required for positioning    Comments  4 times      Lumbar Exercises: Standing   Functional Squats  10 reps;Limitations    Functional Squats Limitations  2 sets, wall squat    Other Standing Lumbar Exercises  forward/lateral step ups 1x10 B 4 inch box       Lumbar Exercises: Supine   Bridge  10 reps;1 second;Limitations    Bridge Limitations  green theraband for abduction, patient with hip drop on left side      Lumbar Exercises: Sidelying   Clam  10 reps;Limitations    Clam Limitations  right sidelying, LLE against gravity      Knee/Hip Exercises: Standing   Rocker Board  2 minutes 2x 1 minute    Rocker Board Limitations  Lateral; forward/backward    Other Standing Knee Exercises  10 x sit to stands with BUE puosh off, RLE on 2 " step to facilitate LLE WB and increase workload for LLE strengthening      Manual Therapy   Manual Therapy  Joint mobilization    Manual therapy comments  Manual complete separate rest of tx    Joint Mobilization  3x 30 seconds lateral glide left hip in 50 degrees flexion      PT Education - 09/11/17 1643    Education provided  Yes    Education Details  educated on hip mobility limitations and discussed benefits of manual therapy to increase ROM for improved strengthening. Educated on proper form/technique for exercises throughout session.     Person(s) Educated  Patient    Methods  Explanation    Comprehension  Verbalized understanding;Need further instruction       PT Short Term Goals - 09/03/17 1532      PT SHORT TERM GOAL #1   Title  Patient to show at least a 50% improvement in lumbar and B hip mobility in order to reduce pain and improve mechanics     Baseline  11/28- stiff but functional     Time  3    Period  Weeks    Status  Partially Met      PT SHORT TERM GOAL #2   Title  Patient to demonsrate improved gait pattern, to include consistent heel-toe pattern, equal step/stance times, improved hip mobility and trunk rotation, elimination of antalgic gait in order to improve efficiency of mobility and to reduce pain     Time  3    Period  Weeks    Status  Achieved      PT SHORT TERM GOAL #3   Title  Patient to experience pain as being no more than 2/10 in order to improve QOL and tolerance to functional task performance     Baseline  11/28- met as long as she keeps up with HEP     Time  3    Period  Weeks    Status  Achieved      PT SHORT TERM GOAL #4   Title  Patient to be compliant with correct performance of HEP, to be updated as able and tolerated     Time  1    Period  Weeks    Status  Achieved        PT Long Term Goals - 09/03/17 1534      PT LONG TERM GOAL #1   Title  Patient to demonstrate improvement of at least 1 MMT grade in all tested groups in order to reduce pain and improve mobility     Time  6    Period  Weeks    Status  Partially Met      PT LONG TERM GOAL #2   Title  Patient to be able to reciprocally ascend/descend at least 4  standard height stairs with U railing, pain no more than 2/10, no unsteadiness in order to improve home and community access     Baseline  11/28- can do it with railing limited by weakness     Time  6    Period  Weeks    Status  Achieved      PT LONG TERM GOAL #3   Title  Patient to report she has been able to work through a full day with pain being no more  than 2/10 to improve QOL and enhance functional task participation     Baseline  11/28- continues to take a  pain pill for work, unsure     Time  6    Period  Weeks    Status  On-going      PT LONG TERM GOAL #4   Title  Patient to be able to sit, stand, and walk for at least 60 consecutive minutes with pain being no more than 2/10 in order to improve QOL and functional task tolerance     Baseline  11/28- black friday she was out most of the day, has not paid a lot of attention to it since     Time  6    Period  Weeks    Status  Partially Met        Plan - 09/11/17 1639    Clinical Impression Statement  Patient is progressing well with skilled PT services. 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 She continues to be limited by severe LLE muscle weakness and has hypomobility in BLE hips upon assessment today, Left>Right. She will continue to benefit from skilled PT services to address current deficits/impairments and continue progressing towards goals to improve QOL.    Rehab Potential  Fair    Clinical Impairments Affecting Rehab Potential  (+) motivated to participate in PT; (-) possible avascular necrosis L hip, anxious, hesitant to return to [facility] for followup due to financial concerns     PT Frequency  2x / week    PT Duration  3 weeks    PT Treatment/Interventions  ADLs/Self Care Home Management;Biofeedback;Cryotherapy;Moist Heat;Gait training;DME Instruction;Stair training;Functional mobility training;Therapeutic activities;Therapeutic exercise;Balance training;Neuromuscular re-education;Patient/family education;Manual techniques;Passive range of motion;Dry needling;Energy conservation;Taping    PT Next Visit Plan  focus on functional strength primarily.  continue with sit to stands again next session. Initiate lateral glides for hip ROM to improve functional mobility and range for  functional strengthening. Update HEP.    PT Home Exercise Plan  eval: SKTC, lumbar rotations, TA sets, bridges, brace supine marches; 3D hip excursion; supine marching 11/28- isometric standing hip ABD     Consulted and Agree with Plan of Care  Patient       Patient will benefit from skilled therapeutic intervention in order to improve the following deficits and impairments:  Abnormal gait, Increased fascial restricitons, Improper body mechanics, Pain, Decreased coordination, Increased muscle spasms, Postural dysfunction, Decreased activity tolerance, Decreased range of motion, Decreased strength, Hypomobility, Difficulty walking, Impaired flexibility  Visit Diagnosis: Pain in left hip  Pain in right hip  Chronic low back pain without sciatica, unspecified back pain laterality  Muscle weakness (generalized)  Other symptoms and signs involving the musculoskeletal system  Difficulty in walking, not elsewhere classified     Problem List Patient Active Problem List   Diagnosis Date Noted  . Vitamin D deficiency 02/24/2017  .  HLD (hyperlipidemia) 02/24/2017  . Morbid obesity ([place]) 01/23/2017  . Depression with anxiety 01/23/2017  . Primary osteoarthritis of both hips 01/23/2017  . Primary osteoarthritis of both knees 01/23/2017  . Simple hepatic cyst 01/23/2017     Debara Pickett, PT, DPT Physical Therapist with [facility]  09/11/2017 5:53 PM    [place] [address] [place], [place], [address] Phone: [phone]   Fax:  [phone]  Name: [name] MRN: [id number] Date of Birth: [birth year]

## 2017-09-12 ENCOUNTER — Ambulatory Visit (HOSPITAL_COMMUNITY): Payer: BC Managed Care – PPO

## 2017-09-12 ENCOUNTER — Encounter (HOSPITAL_COMMUNITY): Payer: Self-pay

## 2017-09-12 DIAGNOSIS — M25552 Pain in left hip: Secondary | ICD-10-CM

## 2017-09-12 DIAGNOSIS — M25551 Pain in right hip: Secondary | ICD-10-CM

## 2017-09-12 DIAGNOSIS — R29898 Other symptoms and signs involving the musculoskeletal system: Secondary | ICD-10-CM

## 2017-09-12 DIAGNOSIS — M6281 Muscle weakness (generalized): Secondary | ICD-10-CM

## 2017-09-12 DIAGNOSIS — G8929 Other chronic pain: Secondary | ICD-10-CM

## 2017-09-12 DIAGNOSIS — R262 Difficulty in walking, not elsewhere classified: Secondary | ICD-10-CM

## 2017-09-12 DIAGNOSIS — M545 Low back pain: Secondary | ICD-10-CM

## 2017-09-12 NOTE — Therapy (Signed)
[name], [place], [address] Phone: [phone]   Fax:  [phone]  Physical Therapy Treatment  Patient Details  Name: [name] MRN: [id number] Date of Birth: [birth year] Referring Provider: Arther Abbott    Encounter Date: 09/12/2017  PT End of Session - 09/12/17 1617    Visit Number  14    Number of Visits  18    Date for PT Re-Evaluation  09/24/17    Authorization Type  BCBS PPO Other (based on medical necessity)    Authorization Time Period  07/23/17 to 09/03/17; 11/28-12/19/2018    PT Start Time  1610    PT Stop Time  1650    PT Time Calculation (min)  40 min    Activity Tolerance  Patient tolerated treatment well;Patient limited by fatigue;No increased pain    Behavior During Therapy  WFL for tasks assessed/performed       Past Medical History:  Diagnosis Date  . Anxiety   . Depression   . HLD (hyperlipidemia) 02/24/2017  . Primary osteoarthritis of both knees 01/23/2017    Past Surgical History:  Procedure Laterality Date  . TUBAL LIGATION      There were no vitals filed for this visit.  Subjective Assessment - 09/12/17 1613    Subjective  Pt stated she is feeling a lot better compared to yesterday.  Current pain scale 2/10 Lt hip achey sharp intermittent pain with movements.  Continues to c/o difficulty with sit to stand.    Pertinent History  OA of hips and knees, anxiety and depression     Patient Stated Goals  learn some exercises to manage pain    Currently in Pain?  Yes    Pain Score  2     Pain Location  Hip hip and knee    Pain Orientation  Left    Pain Descriptors / Indicators  Aching;Sharp    Pain Type  Chronic pain    Pain Onset  More than a month ago    Pain Frequency  Intermittent    Aggravating Factors   nothing    Pain Relieving Factors  exercises    Effect of Pain on Daily Activities  mild                      [facility] Adult PT Treatment/Exercise  - 09/12/17 0001      Lumbar Exercises: Standing   Wall Slides  10 reps;3 seconds    Other Standing Lumbar Exercises  forward/lateral step ups 1x10 B 4 inch box     Other Standing Lumbar Exercises  sidestep GTB 2RT and tandem stance on foam      Lumbar Exercises: Seated   Sit to Stand  10 reps      Lumbar Exercises: Supine   Bent Knee Raise  15 reps    Bent Knee Raise Limitations  alternating    Bridge  10 reps 2 sets 10x each      Lumbar Exercises: Sidelying   Clam  10 reps;Limitations    Clam Limitations  right sidelying, LLE against gravity             PT Education - 09/11/17 1643    Education provided  Yes    Education Details  educated on hip mobility limitations and discussed benefits of manual therapy to increase ROM for improved strengthening. Educated on proper form/technique for exercises throughout session.    Person(s) Educated  Patient    Methods  Explanation    Comprehension  Verbalized understanding;Need further instruction       PT Short Term Goals - 09/03/17 1532      PT SHORT TERM GOAL #1   Title  Patient to show at least a 50% improvement in lumbar and B hip mobility in order to reduce pain and improve mechanics     Baseline  11/28- stiff but functional     Time  3    Period  Weeks    Status  Partially Met      PT SHORT TERM GOAL #2   Title  Patient to demonsrate improved gait pattern, to include consistent heel-toe pattern, equal step/stance times, improved hip mobility and trunk rotation, elimination of antalgic gait in order to improve efficiency of mobility and to reduce pain     Time  3    Period  Weeks    Status  Achieved      PT SHORT TERM GOAL #3   Title  Patient to experience pain as being no more than 2/10 in order to improve QOL and tolerance to functional task performance     Baseline  11/28- met as long as she keeps up with HEP     Time  3    Period  Weeks    Status  Achieved      PT SHORT TERM GOAL #4   Title  Patient to be  compliant with correct performance of HEP, to be updated as able and tolerated     Time  1    Period  Weeks    Status  Achieved        PT Long Term Goals - 09/03/17 1534      PT LONG TERM GOAL #1   Title  Patient to demonstrate improvement of at least 1 MMT grade in all tested groups in order to reduce pain and improve mobility     Time  6    Period  Weeks    Status  Partially Met      PT LONG TERM GOAL #2   Title  Patient to be able to reciprocally ascend/descend at least 4 standard height stairs with U railing, pain no more than 2/10, no unsteadiness in order to improve home and community access     Baseline  11/28- can do it with railing limited by weakness     Time  6    Period  Weeks    Status  Achieved      PT LONG TERM GOAL #3   Title  Patient to report she has been able to work through a full day with pain being no more than 2/10 to improve QOL and enhance functional task participation     Baseline  11/28- continues to take a  pain pill for work, unsure     Time  6    Period  Weeks    Status  On-going      PT LONG TERM GOAL #4   Title  Patient to be able to sit, stand, and walk for at least 60 consecutive minutes with pain being no more than 2/10 in order to improve QOL and functional task tolerance     Baseline  11/28- black friday she was out most of the day, has not paid a lot of attention to it since     Time  6    Period  Weeks    Status  Partially  Met            Plan - 09/12/17 1636    Clinical Impression Statement  Continued session focus with hip/functional strengthening.  Pt continues to demonstrate severe Lt LE weakness and hypomobility in BLE hip.  Pt progressing well with no reports of increased pain through session, was limited by fatigue with task with 2 seated rest breaks required.  Increased to 2 sets with some exercises for activity tolerance with functional strengthening activities.  Added dynamic balance activities to address hip stability.       Rehab Potential  Fair    Clinical Impairments Affecting Rehab Potential  (+) motivated to participate in PT; (-) possible avascular necrosis L hip, anxious, hesitant to return to [facility] for followup due to financial concerns     PT Frequency  2x / week    PT Duration  3 weeks    PT Treatment/Interventions  ADLs/Self Care Home Management;Biofeedback;Cryotherapy;Moist Heat;Gait training;DME Instruction;Stair training;Functional mobility training;Therapeutic activities;Therapeutic exercise;Balance training;Neuromuscular re-education;Patient/family education;Manual techniques;Passive range of motion;Dry needling;Energy conservation;Taping    PT Next Visit Plan  focus on functional strength primarily.  continue with sit to stands again next session. Initiate lateral glides for hip ROM to improve functional mobility and range for functional strengthening. Update HEP.    PT Home Exercise Plan  eval: SKTC, lumbar rotations, TA sets, bridges, brace supine marches; 3D hip excursion; supine marching 11/28- isometric standing hip ABD        Patient will benefit from skilled therapeutic intervention in order to improve the following deficits and impairments:  Abnormal gait, Increased fascial restricitons, Improper body mechanics, Pain, Decreased coordination, Increased muscle spasms, Postural dysfunction, Decreased activity tolerance, Decreased range of motion, Decreased strength, Hypomobility, Difficulty walking, Impaired flexibility  Visit Diagnosis: Pain in left hip  Pain in right hip  Chronic low back pain without sciatica, unspecified back pain laterality  Muscle weakness (generalized)  Other symptoms and signs involving the musculoskeletal system  Difficulty in walking, not elsewhere classified     Problem List Patient Active Problem List   Diagnosis Date Noted  . Vitamin D deficiency 02/24/2017  . HLD (hyperlipidemia) 02/24/2017  . Morbid obesity ([place]) 01/23/2017  . Depression with anxiety  01/23/2017  . Primary osteoarthritis of both hips 01/23/2017  . Primary osteoarthritis of both knees 01/23/2017  . Simple hepatic cyst 01/23/2017   Ihor Austin, LPTA; CBIS [phone]  Aldona Lento 09/12/2017, 4:56 PM  [place] [address] [place], [place], [address] Phone: [phone]   Fax:  [phone]  Name: NAHOMY LIMBURG MRN: [id number] Date of Birth: [birth year]

## 2017-09-17 ENCOUNTER — Ambulatory Visit (HOSPITAL_COMMUNITY): Payer: BC Managed Care – PPO

## 2017-09-17 ENCOUNTER — Encounter (HOSPITAL_COMMUNITY): Payer: Self-pay

## 2017-09-17 DIAGNOSIS — M25551 Pain in right hip: Secondary | ICD-10-CM

## 2017-09-17 DIAGNOSIS — R29898 Other symptoms and signs involving the musculoskeletal system: Secondary | ICD-10-CM

## 2017-09-17 DIAGNOSIS — M25552 Pain in left hip: Secondary | ICD-10-CM

## 2017-09-17 DIAGNOSIS — G8929 Other chronic pain: Secondary | ICD-10-CM

## 2017-09-17 DIAGNOSIS — R262 Difficulty in walking, not elsewhere classified: Secondary | ICD-10-CM

## 2017-09-17 DIAGNOSIS — M6281 Muscle weakness (generalized): Secondary | ICD-10-CM

## 2017-09-17 DIAGNOSIS — M545 Low back pain: Secondary | ICD-10-CM

## 2017-09-17 NOTE — Therapy (Signed)
[place], [place], [address] Phone: [phone]   Fax:  [phone]  Physical Therapy Treatment  Patient Details  Name: [name] MRN: [id number] Date of Birth: 1971/08/08 Referring Provider: Arther Abbott    Encounter Date: 09/17/2017  PT End of Session - 09/17/17 1044    Visit Number  15    Number of Visits  18    Date for PT Re-Evaluation  09/24/17    Authorization Type  [employer] PPO Other (based on medical necessity)    Authorization Time Period  07/23/17 to 09/03/17; 11/28-12/19/2018    PT Start Time  1032    PT Stop Time  1115    PT Time Calculation (min)  43 min    Activity Tolerance  Patient tolerated treatment well;Patient limited by fatigue;No increased pain    Behavior During Therapy  WFL for tasks assessed/performed       Past Medical History:  Diagnosis Date  . Anxiety   . Depression   . HLD (hyperlipidemia) 02/24/2017  . Primary osteoarthritis of both knees 01/23/2017    Past Surgical History:  Procedure Laterality Date  . TUBAL LIGATION      There were no vitals filed for this visit.  Subjective Assessment - 09/17/17 1036    Subjective  Pt notes that through last evening she had a lot of ache from hip down into ankle laterally, but overall feeling pretty good this morning.     Currently in Pain?  No/denies                      [facility] Adult PT Treatment/Exercise - 09/17/17 0001      Lumbar Exercises: Standing   Push / Pull Sled  TKE with blue theraband x 10, 5s hold    Other Standing Lumbar Exercises  forward/lateral step ups 1x10 B 4 inch box     Other Standing Lumbar Exercises  sidestep GTB 2RT and tandem stance on foam 2 x 30s, and with ball toss 10 passes x 2 sets      Lumbar Exercises: Seated   Sit to Stand  10 reps      Lumbar Exercises: Supine   Clam  10 reps red tband, 2 sets    Bent Knee Raise  15 reps    Bent Knee Raise Limitations  alternating     Bridge  10 reps 2 sets    Other Supine Lumbar Exercises  adductor isometric x 10-, 3s hold      Lumbar Exercises: Sidelying   Clam  10 reps;Limitations    Clam Limitations  right sidelying, LLE against gravity      Manual Therapy   Manual Therapy  Joint mobilization    Manual therapy comments  Manual complete separate rest of tx    Joint Mobilization  5 x 10 seconds latearl glide left hip in 50 degrees flexion; 5 reps long axis distraction 30 deg flexion and abduction               PT Short Term Goals - 09/03/17 1532      PT SHORT TERM GOAL #1   Title  Patient to show at least a 50% improvement in lumbar and B hip mobility in order to reduce pain and improve mechanics     Baseline  11/28- stiff but functional     Time  3    Period  Weeks    Status  Partially  Met      PT SHORT TERM GOAL #2   Title  Patient to demonsrate improved gait pattern, to include consistent heel-toe pattern, equal step/stance times, improved hip mobility and trunk rotation, elimination of antalgic gait in order to improve efficiency of mobility and to reduce pain     Time  3    Period  Weeks    Status  Achieved      PT SHORT TERM GOAL #3   Title  Patient to experience pain as being no more than 2/10 in order to improve QOL and tolerance to functional task performance     Baseline  11/28- met as long as she keeps up with HEP     Time  3    Period  Weeks    Status  Achieved      PT SHORT TERM GOAL #4   Title  Patient to be compliant with correct performance of HEP, to be updated as able and tolerated     Time  1    Period  Weeks    Status  Achieved        PT Long Term Goals - 09/03/17 1534      PT LONG TERM GOAL #1   Title  Patient to demonstrate improvement of at least 1 MMT grade in all tested groups in order to reduce pain and improve mobility     Time  6    Period  Weeks    Status  Partially Met      PT LONG TERM GOAL #2   Title  Patient to be able to reciprocally  ascend/descend at least 4 standard height stairs with U railing, pain no more than 2/10, no unsteadiness in order to improve home and community access     Baseline  11/28- can do it with railing limited by weakness     Time  6    Period  Weeks    Status  Achieved      PT LONG TERM GOAL #3   Title  Patient to report she has been able to work through a full day with pain being no more than 2/10 to improve QOL and enhance functional task participation     Baseline  11/28- continues to take a  pain pill for work, unsure     Time  6    Period  Weeks    Status  On-going      PT LONG TERM GOAL #4   Title  Patient to be able to sit, stand, and walk for at least 60 consecutive minutes with pain being no more than 2/10 in order to improve QOL and functional task tolerance     Baseline  11/28- black friday she was out most of the day, has not paid a lot of attention to it since     Time  6    Period  Weeks    Status  Partially Met            Plan - 09/17/17 1117    Clinical Impression Statement   Today's session continued to focus on LE strengthening and hip mobility. Patient continues to be challenged by her current HEP, therefore, therapist did not change this session. Pt has continued difficulty with Lt. LE > Rt. LE with exercises in side lying for hip abduction strength. Pt continues to be challenged the most with STS exercise demonstrating decreased endurance. Pt demonstrated Lt knee hyperextension with STS exercise and therapist added  TKE in standing with blue theraband to help with control. Pt demonstrated good control with forward step ups and lateral step ups with reports of a challenge still but that it's starting to get easier. Potentially increase step height next session. No pain noted throughout session or at end of session.     Rehab Potential  Fair    Clinical Impairments Affecting Rehab Potential  (+) motivated to participate in PT; (-) possible avascular necrosis L hip, anxious,  hesitant to return to MD for followup due to financial concerns     PT Frequency  2x / week    PT Duration  3 weeks    PT Treatment/Interventions  ADLs/Self Care Home Management;Biofeedback;Cryotherapy;Moist Heat;Gait training;DME Instruction;Stair training;Functional mobility training;Therapeutic activities;Therapeutic exercise;Balance training;Neuromuscular re-education;Patient/family education;Manual techniques;Passive range of motion;Dry needling;Energy conservation;Taping    PT Next Visit Plan  Increase step height next session if tolerated. focus on functional strength primarily.  Initiate lateral glides for hip ROM to improve functional mobility and range for functional strengthening.     PT Home Exercise Plan  eval: SKTC, lumbar rotations, TA sets, bridges, brace supine marches; 3D hip excursion; supine marching 11/28- isometric standing hip ABD        Patient will benefit from skilled therapeutic intervention in order to improve the following deficits and impairments:  Abnormal gait, Increased fascial restricitons, Improper body mechanics, Pain, Decreased coordination, Increased muscle spasms, Postural dysfunction, Decreased activity tolerance, Decreased range of motion, Decreased strength, Hypomobility, Difficulty walking, Impaired flexibility  Visit Diagnosis: Pain in left hip  Pain in right hip  Chronic low back pain without sciatica, unspecified back pain laterality  Muscle weakness (generalized)  Other symptoms and signs involving the musculoskeletal system  Difficulty in walking, not elsewhere classified     Problem List Patient Active Problem List   Diagnosis Date Noted  . Vitamin D deficiency 02/24/2017  . HLD (hyperlipidemia) 02/24/2017  . Morbid obesity ([place]) 01/23/2017  . Depression with anxiety 01/23/2017  . Primary osteoarthritis of both hips 01/23/2017  . Primary osteoarthritis of both knees 01/23/2017  . Simple hepatic cyst 01/23/2017   Starr Lake PT,  DPT 11:19 AM, 09/17/17 [place] [address] [place], [place], [address] Phone: [phone]   Fax:  [phone]  Name: [name] MRN: [id number] Date of Birth: 05-20-1971

## 2017-09-19 ENCOUNTER — Encounter (HOSPITAL_COMMUNITY): Payer: Self-pay

## 2017-09-19 ENCOUNTER — Ambulatory Visit (HOSPITAL_COMMUNITY): Payer: BC Managed Care – PPO

## 2017-09-19 ENCOUNTER — Telehealth (HOSPITAL_COMMUNITY): Payer: Self-pay

## 2017-09-19 DIAGNOSIS — M25551 Pain in right hip: Secondary | ICD-10-CM

## 2017-09-19 DIAGNOSIS — M545 Low back pain, unspecified: Secondary | ICD-10-CM

## 2017-09-19 DIAGNOSIS — M6281 Muscle weakness (generalized): Secondary | ICD-10-CM

## 2017-09-19 DIAGNOSIS — M25552 Pain in left hip: Secondary | ICD-10-CM

## 2017-09-19 DIAGNOSIS — R262 Difficulty in walking, not elsewhere classified: Secondary | ICD-10-CM

## 2017-09-19 DIAGNOSIS — G8929 Other chronic pain: Secondary | ICD-10-CM

## 2017-09-19 DIAGNOSIS — R29898 Other symptoms and signs involving the musculoskeletal system: Secondary | ICD-10-CM

## 2017-09-19 NOTE — Telephone Encounter (Signed)
Offered pt an earlier apptment, pt said she could not get here any earlier. NF 09/19/17

## 2017-09-19 NOTE — Therapy (Signed)
[place], [place], [address] Phone: [phone]   Fax:  [phone]  Physical Therapy Treatment  Patient Details  Name: [name] MRN: [id number] Date of Birth: 1971-09-04 Referring Provider: Arther Abbott    Encounter Date: 09/19/2017  PT End of Session - 09/19/17 1655    Visit Number  16    Number of Visits  18    Date for PT Re-Evaluation  09/24/17    Authorization Type  [employer] PPO Other (based on medical necessity)    Authorization Time Period  07/23/17 to 09/03/17; 11/28-12/19/2018    PT Start Time  8676    PT Stop Time  1734    PT Time Calculation (min)  44 min    Activity Tolerance  Patient tolerated treatment well;Patient limited by fatigue;No increased pain    Behavior During Therapy  WFL for tasks assessed/performed       Past Medical History:  Diagnosis Date  . Anxiety   . Depression   . HLD (hyperlipidemia) 02/24/2017  . Primary osteoarthritis of both knees 01/23/2017    Past Surgical History:  Procedure Laterality Date  . TUBAL LIGATION      There were no vitals filed for this visit.  Subjective Assessment - 09/19/17 1652    Subjective  Pt reports improvements with sleep at night and decrease in pain though it does come and go.  Reports main c/o with stiffness in Lt hip joint.    Pertinent History  OA of hips and knees, anxiety and depression     Patient Stated Goals  learn some exercises to manage pain    Currently in Pain?  No/denies hip stiffness                      [facility] Adult PT Treatment/Exercise - 09/19/17 0001      Lumbar Exercises: Stretches   Lower Trunk Rotation  10 seconds  (Pended)     Lower Trunk Rotation Limitations  10x 10"   (Pended)       Lumbar Exercises: Standing   Functional Squats  10 reps;Limitations  (Pended)     Functional Squats Limitations  2 sets, wall squat  (Pended)     Other Standing Lumbar Exercises  forward/lateral step ups  1x10 B 6 inch box   (Pended)     Other Standing Lumbar Exercises  sidestep GTB 2RT and tandem stance on foam 2 x 30s, and with ball toss 10 passes x 2 sets  (Pended)       Lumbar Exercises: Seated   Sit to Stand  10 reps;Limitations  (Pended)       Lumbar Exercises: Supine   Bent Knee Raise  15 reps  (Pended)     Bent Knee Raise Limitations  alternating  (Pended)     Bridge  10 reps;5 seconds  (Pended)  2 sets    Bridge Limitations  green theraband for abduction, patient with hip drop on left side  (Pended)     Other Supine Lumbar Exercises  adductor isometric x 10-, 3s hold  (Pended)       Lumbar Exercises: Sidelying   Clam  10 reps;Limitations  (Pended)     Clam Limitations  right sidelying, LLE against gravity  (Pended)                PT Short Term Goals - 09/03/17 1532      PT SHORT TERM GOAL #1  Title  Patient to show at least a 50% improvement in lumbar and B hip mobility in order to reduce pain and improve mechanics     Baseline  11/28- stiff but functional     Time  3    Period  Weeks    Status  Partially Met      PT SHORT TERM GOAL #2   Title  Patient to demonsrate improved gait pattern, to include consistent heel-toe pattern, equal step/stance times, improved hip mobility and trunk rotation, elimination of antalgic gait in order to improve efficiency of mobility and to reduce pain     Time  3    Period  Weeks    Status  Achieved      PT SHORT TERM GOAL #3   Title  Patient to experience pain as being no more than 2/10 in order to improve QOL and tolerance to functional task performance     Baseline  11/28- met as long as she keeps up with HEP     Time  3    Period  Weeks    Status  Achieved      PT SHORT TERM GOAL #4   Title  Patient to be compliant with correct performance of HEP, to be updated as able and tolerated     Time  1    Period  Weeks    Status  Achieved        PT Long Term Goals - 09/03/17 1534      PT LONG TERM GOAL #1   Title   Patient to demonstrate improvement of at least 1 MMT grade in all tested groups in order to reduce pain and improve mobility     Time  6    Period  Weeks    Status  Partially Met      PT LONG TERM GOAL #2   Title  Patient to be able to reciprocally ascend/descend at least 4 standard height stairs with U railing, pain no more than 2/10, no unsteadiness in order to improve home and community access     Baseline  11/28- can do it with railing limited by weakness     Time  6    Period  Weeks    Status  Achieved      PT LONG TERM GOAL #3   Title  Patient to report she has been able to work through a full day with pain being no more than 2/10 to improve QOL and enhance functional task participation     Baseline  11/28- continues to take a  pain pill for work, unsure     Time  6    Period  Weeks    Status  On-going      PT LONG TERM GOAL #4   Title  Patient to be able to sit, stand, and walk for at least 60 consecutive minutes with pain being no more than 2/10 in order to improve QOL and functional task tolerance     Baseline  11/28- black friday she was out most of the day, has not paid a lot of attention to it since     Time  6    Period  Weeks    Status  Partially Met            Plan - 09/19/17 1744    Clinical Impression Statement  Continued today's session focus with LE strengthening and hip mobilty.  Pt continues to have difficulty with Lt  LE > Rt LE wiht exercises especially wiht proximal hip musculature.  Pt with tendency to hyperextend Lt knee with STS, educated with technqiues to improve strengthening and reduce locking of joints for pain control and functional strengthening.  Pt continues to fatigue easily with tasks.  Was able to increase height with step training today with cueing to reduce compensation.  EOS pt was limited by fatigue, no reports of pain.      Rehab Potential  Fair    Clinical Impairments Affecting Rehab Potential  (+) motivated to participate in PT; (-)  possible avascular necrosis L hip, anxious, hesitant to return to MD for followup due to financial concerns     PT Frequency  2x / week    PT Duration  3 weeks    PT Treatment/Interventions  ADLs/Self Care Home Management;Biofeedback;Cryotherapy;Moist Heat;Gait training;DME Instruction;Stair training;Functional mobility training;Therapeutic activities;Therapeutic exercise;Balance training;Neuromuscular re-education;Patient/family education;Manual techniques;Passive range of motion;Dry needling;Energy conservation;Taping    PT Next Visit Plan  Reassess next session.      PT Home Exercise Plan  eval: SKTC, lumbar rotations, TA sets, bridges, brace supine marches; 3D hip excursion; supine marching 11/28- isometric standing hip ABD     Consulted and Agree with Plan of Care  Patient       Patient will benefit from skilled therapeutic intervention in order to improve the following deficits and impairments:  Abnormal gait, Increased fascial restricitons, Improper body mechanics, Pain, Decreased coordination, Increased muscle spasms, Postural dysfunction, Decreased activity tolerance, Decreased range of motion, Decreased strength, Hypomobility, Difficulty walking, Impaired flexibility  Visit Diagnosis: Pain in left hip  Pain in right hip  Chronic low back pain without sciatica, unspecified back pain laterality  Muscle weakness (generalized)  Other symptoms and signs involving the musculoskeletal system  Difficulty in walking, not elsewhere classified     Problem List Patient Active Problem List   Diagnosis Date Noted  . Vitamin D deficiency 02/24/2017  . HLD (hyperlipidemia) 02/24/2017  . Morbid obesity ([place]) 01/23/2017  . Depression with anxiety 01/23/2017  . Primary osteoarthritis of both hips 01/23/2017  . Primary osteoarthritis of both knees 01/23/2017  . Simple hepatic cyst 01/23/2017   Ihor Austin, [place]; [place]  Aldona Lento 09/19/2017, 6:10 PM  [place], [place], [address] Phone: [phone]   Fax:  [phone]  Name: [name] MRN: [id number] Date of Birth: [birth year]

## 2017-09-23 ENCOUNTER — Ambulatory Visit (HOSPITAL_COMMUNITY): Payer: BC Managed Care – PPO

## 2017-09-23 ENCOUNTER — Encounter (HOSPITAL_COMMUNITY): Payer: Self-pay

## 2017-09-23 DIAGNOSIS — M25552 Pain in left hip: Secondary | ICD-10-CM | POA: Diagnosis not present

## 2017-09-23 DIAGNOSIS — M25551 Pain in right hip: Secondary | ICD-10-CM

## 2017-09-23 DIAGNOSIS — R262 Difficulty in walking, not elsewhere classified: Secondary | ICD-10-CM

## 2017-09-23 DIAGNOSIS — M545 Low back pain, unspecified: Secondary | ICD-10-CM

## 2017-09-23 DIAGNOSIS — G8929 Other chronic pain: Secondary | ICD-10-CM

## 2017-09-23 DIAGNOSIS — M6281 Muscle weakness (generalized): Secondary | ICD-10-CM

## 2017-09-23 DIAGNOSIS — R29898 Other symptoms and signs involving the musculoskeletal system: Secondary | ICD-10-CM

## 2017-09-23 NOTE — Therapy (Signed)
[place], [place], [address] Phone: [phone]   Fax:  [phone]  Physical Therapy Treatment / Discharged  Patient Details  Name: [name] MRN: [id number] Date of Birth: 1971-03-22 Referring Provider: Arther Abbott    Encounter Date: 09/23/2017  PT End of Session - 09/23/17 1735    Visit Number  17    Number of Visits  18    Date for PT Re-Evaluation  09/24/17    Authorization Type  [employer] PPO Other (based on medical necessity)    Authorization Time Period  07/23/17 to 09/03/17; 11/28-12/19/2018    PT Start Time  1645    PT Stop Time  1715    PT Time Calculation (min)  30 min    Activity Tolerance  Patient tolerated treatment well;Patient limited by fatigue;No increased pain    Behavior During Therapy  WFL for tasks assessed/performed       Past Medical History:  Diagnosis Date  . Anxiety   . Depression   . HLD (hyperlipidemia) 02/24/2017  . Primary osteoarthritis of both knees 01/23/2017    Past Surgical History:  Procedure Laterality Date  . TUBAL LIGATION      There were no vitals filed for this visit.  Subjective Assessment - 09/23/17 1646    Subjective  Pt reports that she is in more pain today compared to last week. She notes last night the side of her hip into her knee made it difficult to fall asleep last night. She notes having more difficulty to get up and walk and go; she notes it is limiting her at work and at home. Patient reports manual makes pain worse, but some days it doesn't hurt at all. Patient reports stress tends to increase her pain and when she is not around the stressor she doesn't have pain, potentially doesn't have to take a pain pill.     Currently in Pain?  Yes    Pain Score  5     Pain Location  Hip    Pain Orientation  Left    Pain Descriptors / Indicators  Aching;Sharp    Pain Type  Chronic pain         [facility] PT Assessment - 09/23/17 0001      Observation/Other Assessments   Focus on Therapeutic Outcomes (FOTO)   64% limitation       AROM   Left Hip Flexion  90    Left Hip ABduction  15    Lumbar Flexion  mild limitation  strain left lateral hip; unable to control in extensin    Lumbar - Right Side Bend  WFL    Lumbar - Left Side Bend  WFL strain lateral left hip      Strength   Left Hip Flexion  4+/5    Left Hip ABduction  2+/5    Left Knee Flexion  4/5    Left Knee Extension  5/5      Transfers   Transfers  Supine to Sit;Sit to Supine    Supine to Sit  6: Modified independent (Device/Increase time)    Sit to Supine  6: Modified independent (Device/Increase time)    Transfer Cueing  patient required increased time secondary to pain                           PT Education - 09/23/17 1737    Education provided  Yes  Education Details  Pt educated on ability to provide resources if needed for home situation if she ever feels threatened; patient educated on contributin of stress for symptoms and to follow up with MD regarding continuatino of pain and perceived decline in function.     Person(s) Educated  Patient    Methods  Explanation    Comprehension  Verbalized understanding       PT Short Term Goals - 09/23/17 1706      PT SHORT TERM GOAL #1   Title  Patient to show at least a 50% improvement in lumbar and B hip mobility in order to reduce pain and improve mechanics     Baseline  11/28- stiff but functional     Time  3    Period  Weeks    Status  Partially Met      PT SHORT TERM GOAL #2   Title  Patient to demonsrate improved gait pattern, to include consistent heel-toe pattern, equal step/stance times, improved hip mobility and trunk rotation, elimination of antalgic gait in order to improve efficiency of mobility and to reduce pain     Time  3    Period  Weeks    Status  Achieved      PT SHORT TERM GOAL #3   Title  Patient to experience pain as being no more than 2/10 in order to  improve QOL and tolerance to functional task performance     Baseline  11/28- met as long as she keeps up with HEP     Time  3    Period  Weeks    Status  Achieved      PT SHORT TERM GOAL #4   Title  Patient to be compliant with correct performance of HEP, to be updated as able and tolerated     Time  1    Period  Weeks    Status  Achieved        PT Long Term Goals - 09/23/17 1706      PT LONG TERM GOAL #1   Title  Patient to demonstrate improvement of at least 1 MMT grade in all tested groups in order to reduce pain and improve mobility     Baseline  12/6: improve left hip flexion MMT, knee flexion/extension 5/5, hip abduction 2+ in supine,unable to raise in side-lying due to pain    Time  6    Period  Weeks    Status  Partially Met      PT LONG TERM GOAL #2   Title  Patient to be able to reciprocally ascend/descend at least 4 standard height stairs with U railing, pain no more than 2/10, no unsteadiness in order to improve home and community access     Baseline  11/28- can do it with railing limited by weakness; 12/18 completed without UE use, presenting with descending instability due to left LE pain and weakness     Time  6    Period  Weeks    Status  Achieved      PT LONG TERM GOAL #3   Title  Patient to report she has been able to work through a full day with pain being no more than 2/10 to improve QOL and enhance functional task participation     Baseline  12/28- continues to take a  pain pill for work, but occasionally has relief when she isn't as stressed at home     Time  6  Period  Weeks    Status  On-going      PT LONG TERM GOAL #4   Title  Patient to be able to sit, stand, and walk for at least 60 consecutive minutes with pain being no more than 2/10 in order to improve QOL and functional task tolerance     Baseline  12/18: reports being able to walk 15-20 minutes at one time     Time  6    Period  Weeks    Status  Partially Met            Plan -  09/23/17 1739    Clinical Impression Statement  Reassessment completed this session. At the beginning patient reported wanting to end therapy secondary to feeling she has some days that are good and some days that are really painful and there isn't much carry-over for her to continue. She has made good progress towards goals except regarding pain and perceived function. Patient has compensatory weight shift away from Lt leg with all reports of symptoms localized at latera hip joint. Patiet notes that she contributes her worse pain around when she is stressed, with reports of stress regarding her son. She denies feeling physically threatened at the moment, however, she does report feeling unsafe in the past. Patient has difficuly with forward flexion secondary to instability of the left knee control. Patient enouraged to follow up with her primary physician as her FOTO outcome measure score declined indicating decreased perceived function. She was encourged to continue working on her HEP and to reach out for support to help decrease stress at home and improve overall pain management. Patient discharged at this time per her request and therapist discresion as there is limited carryover at hthis time.     Rehab Potential  Fair    Clinical Impairments Affecting Rehab Potential  (+) motivated to participate in PT; (-) possible avascular necrosis L hip, anxious, hesitant to return to MD for followup due to financial concerns     PT Frequency  2x / week    PT Duration  3 weeks    PT Treatment/Interventions  ADLs/Self Care Home Management;Biofeedback;Cryotherapy;Moist Heat;Gait training;DME Instruction;Stair training;Functional mobility training;Therapeutic activities;Therapeutic exercise;Balance training;Neuromuscular re-education;Patient/family education;Manual techniques;Passive range of motion;Dry needling;Energy conservation;Taping    PT Next Visit Plan  Discharged    PT Home Exercise Plan  eval: SKTC, lumbar  rotations, TA sets, bridges, brace supine marches; 3D hip excursion; supine marching 11/28- isometric standing hip ABD     Consulted and Agree with Plan of Care  Patient       Patient will benefit from skilled therapeutic intervention in order to improve the following deficits and impairments:  Abnormal gait, Increased fascial restricitons, Improper body mechanics, Pain, Decreased coordination, Increased muscle spasms, Postural dysfunction, Decreased activity tolerance, Decreased range of motion, Decreased strength, Hypomobility, Difficulty walking, Impaired flexibility  Visit Diagnosis: Pain in left hip  Pain in right hip  Chronic low back pain without sciatica, unspecified back pain laterality  Muscle weakness (generalized)  Other symptoms and signs involving the musculoskeletal system  Difficulty in walking, not elsewhere classified     Problem List Patient Active Problem List   Diagnosis Date Noted  . Vitamin D deficiency 02/24/2017  . HLD (hyperlipidemia) 02/24/2017  . Morbid obesity ([place]) 01/23/2017  . Depression with anxiety 01/23/2017  . Primary osteoarthritis of both hips 01/23/2017  . Primary osteoarthritis of both knees 01/23/2017  . Simple hepatic cyst 01/23/2017   Starr Lake PT, DPT  5:44 PM, 09/23/17 [place], [place], [address] Phone: [phone]   Fax:  [phone]  Name: JALESA THIEN MRN: [id number] Date of Birth: 02-13-1971

## 2017-09-25 ENCOUNTER — Encounter (HOSPITAL_COMMUNITY): Payer: Self-pay

## 2017-11-13 ENCOUNTER — Other Ambulatory Visit: Payer: Self-pay

## 2017-11-13 ENCOUNTER — Telehealth: Payer: Self-pay | Admitting: Family Medicine

## 2017-11-13 ENCOUNTER — Encounter (HOSPITAL_COMMUNITY): Payer: Self-pay | Admitting: Emergency Medicine

## 2017-11-13 ENCOUNTER — Emergency Department (HOSPITAL_COMMUNITY)
Admission: EM | Admit: 2017-11-13 | Discharge: 2017-11-13 | Disposition: A | Discharge status: Home / Self Care | Payer: BC Managed Care – PPO | Attending: Emergency Medicine | Admitting: Emergency Medicine

## 2017-11-13 DIAGNOSIS — M160 Bilateral primary osteoarthritis of hip: Secondary | ICD-10-CM

## 2017-11-13 DIAGNOSIS — Z79899 Other long term (current) drug therapy: Secondary | ICD-10-CM | POA: Insufficient documentation

## 2017-11-13 DIAGNOSIS — Z87891 Personal history of nicotine dependence: Secondary | ICD-10-CM | POA: Diagnosis not present

## 2017-11-13 DIAGNOSIS — M25552 Pain in left hip: Secondary | ICD-10-CM | POA: Insufficient documentation

## 2017-11-13 DIAGNOSIS — M5442 Lumbago with sciatica, left side: Principal | ICD-10-CM

## 2017-11-13 DIAGNOSIS — M5441 Lumbago with sciatica, right side: Secondary | ICD-10-CM

## 2017-11-13 MED ORDER — IBUPROFEN 800 MG PO TABS: 800.0000 mg | ORAL_TABLET | Freq: Three times a day (TID) | ORAL | 0 refills | Status: DC | PRN

## 2017-11-13 MED ORDER — METHYLPREDNISOLONE SODIUM SUCC 125 MG IJ SOLR
125.0000 mg | Freq: Once | INTRAMUSCULAR | Status: AC
  Administered 2017-11-13: 125 mg via INTRAMUSCULAR
  Filled 2017-11-13: qty 2

## 2017-11-13 MED ORDER — METHOCARBAMOL 500 MG PO TABS: 500.0000 mg | ORAL_TABLET | Freq: Two times a day (BID) | ORAL | 0 refills | Status: DC

## 2017-11-13 MED ORDER — MELOXICAM 7.5 MG PO TABS: 7.5000 mg | ORAL_TABLET | Freq: Every day | ORAL | 0 refills | Status: DC

## 2017-11-13 MED ORDER — KETOROLAC TROMETHAMINE 60 MG/2ML IM SOLN
60.0000 mg | Freq: Once | INTRAMUSCULAR | Status: AC
  Administered 2017-11-13: 60 mg via INTRAMUSCULAR
  Filled 2017-11-13: qty 2

## 2017-11-13 NOTE — Telephone Encounter (Signed)
Seen 9 18 18 

## 2017-11-13 NOTE — ED Provider Notes (Signed)
[facility] EMERGENCY DEPARTMENT Provider Note   CSN: [id number] Arrival date & time: 11/13/17  1857     History   Chief Complaint Chief Complaint  Patient presents with  . Hip Pain    HPI [name] is a 47 y.o. female.  The history is provided by the patient. No language interpreter was used.  Hip Pain  This is a recurrent problem. The current episode started more than 2 days ago. The problem occurs constantly. The problem has been gradually worsening. Nothing aggravates the symptoms. Nothing relieves the symptoms.   Pt has chronic left hip problems.  Pt has been diagnosed with avascular necrosis. Pt has recent finished physical  therapy with no improvement.  Pt reports she is now having more trouble walking. Past Medical History:  Diagnosis Date  . Anxiety   . Depression   . HLD (hyperlipidemia) 02/24/2017  . Primary osteoarthritis of both knees 01/23/2017    Patient Active Problem List   Diagnosis Date Noted  . Vitamin D deficiency 02/24/2017  . HLD (hyperlipidemia) 02/24/2017  . Morbid obesity ([facility]) 01/23/2017  . Depression with anxiety 01/23/2017  . Primary osteoarthritis of both hips 01/23/2017  . Primary osteoarthritis of both knees 01/23/2017  . Simple hepatic cyst 01/23/2017    Past Surgical History:  Procedure Laterality Date  . TUBAL LIGATION      OB History    No data available       Home Medications    Prior to Admission medications   Medication Sig Start Date End Date Taking? Authorizing Provider  DULoxetine (CYMBALTA) 30 MG capsule Take 1 capsule (30 mg total) by mouth daily. 06/24/17   Eustace Moore, MD  ibuprofen (ADVIL,MOTRIN) 800 MG tablet Take 1 tablet (800 mg total) by mouth every 8 (eight) hours as needed. 11/13/17   Eustace Moore, MD  methocarbamol (ROBAXIN) 500 MG tablet Take 1 tablet (500 mg total) by mouth 3 (three) times daily. 06/30/17   Vickki Hearing, MD    Family History Family History  Problem Relation Age  of Onset  . Diabetes Mother   . Hypertension Mother   . Stroke Father   . Dementia Father   . Early death Brother        MVA    Social History Social History   Tobacco Use  . Smoking status: Former Games developer  . Smokeless tobacco: Never Used  Substance Use Topics  . Alcohol use: No  . Drug use: No     Allergies   Aspirin   Review of Systems Review of Systems  All other systems reviewed and are negative.    Physical Exam Updated Vital Signs BP (!) 156/68 (BP Location: Right Leg)   Pulse 76   Temp 99.2 F (37.3 C) (Oral)   Resp 18   Ht 5\' 9"  (1.753 m)   Wt 127 kg (280 lb)   LMP 10/20/2017 (Approximate)   SpO2 97%   BMI 41.35 kg/m   Physical Exam  Constitutional: She is oriented to person, place, and time. She appears well-developed and well-nourished.  HENT:  Head: Normocephalic.  Eyes: EOM are normal.  Neck: Normal range of motion.  Pulmonary/Chest: Effort normal.  Abdominal: She exhibits no distension.  Musculoskeletal:  Pain with straight leg raise  Neurological: She is alert and oriented to person, place, and time.  Psychiatric: She has a normal mood and affect.  Nursing note and vitals reviewed.    ED Treatments / Results  Labs (  all labs ordered are listed, but only abnormal results are displayed) Labs Reviewed - No data to display  EKG  EKG Interpretation None       Radiology No results found.  Procedures Procedures (including critical care time)  Medications Ordered in ED Medications - No data to display   Initial Impression / Assessment and Plan / ED Course  I have reviewed the triage vital signs and the nursing notes.  Pertinent labs & imaging results that were available during my care of the patient were reviewed by me and considered in my medical decision making (see chart for details).     MDM   I reviewed pt's xrays.  Xrays show avascular necrosis and arthritis.  Pt needs to follow up with Dr. Romeo AppleHarrison.  I will try  torodol and solumedrol to help with current exacerbation.    Final Clinical Impressions(s) / ED Diagnoses   Final diagnoses:  Left hip pain    ED Discharge Orders    None    An After Visit Summary was printed and given to the patient.  Meds ordered this encounter  Medications  . ketorolac (TORADOL) injection 60 mg  . methylPREDNISolone sodium succinate (SOLU-MEDROL) 125 mg/2 mL injection 125 mg  . meloxicam (MOBIC) 7.5 MG tablet    Sig: Take 1 tablet (7.5 mg total) by mouth daily.    Dispense:  20 tablet    Refill:  0    Order Specific Question:   Supervising Provider    Answer:   MILLER, BRIAN [[id number]]  . methocarbamol (ROBAXIN) 500 MG tablet    Sig: Take 1 tablet (500 mg total) by mouth 2 (two) times daily.    Dispense:  20 tablet    Refill:  0    Order Specific Question:   Supervising Provider    Answer:   Eber HongMILLER, BRIAN [[id number]]      Elson AreasSofia, Zedrick Springsteen K, PA-C 11/13/17 2102    Mancel BaleWentz, Elliott, MD 11/14/17 [phone]

## 2017-11-13 NOTE — ED Notes (Signed)
Pt takes prescribed Ibprofen (800mg ) at home for pain. Pt states she last took it at 1700 today. The Pt states the Ibprofen does not provide any relief.

## 2017-11-13 NOTE — Telephone Encounter (Signed)
Patient is requesting a pain medication for arthritis Cb#: [phone] [facility]

## 2017-11-13 NOTE — ED Triage Notes (Signed)
Pt C/O hip pain that started 3 days ago. Pt states she has history of arthritis. Pt has taken 800mg  ibuprofen around 1700 today with no relief.

## 2017-12-17 ENCOUNTER — Ambulatory Visit: Payer: BC Managed Care – PPO | Admitting: Orthopedic Surgery

## 2017-12-17 ENCOUNTER — Encounter: Payer: Self-pay | Admitting: Orthopedic Surgery

## 2017-12-17 VITALS — BP 123/59 | HR 73 | Ht 69.0 in | Wt 287.0 lb

## 2017-12-17 DIAGNOSIS — M5442 Lumbago with sciatica, left side: Secondary | ICD-10-CM

## 2017-12-17 NOTE — Progress Notes (Signed)
Progress Note   Patient ID: [name], female   DOB: 1971/07/23, 47 y.o.   MRN: [id number]  Chief Complaint  Patient presents with  . Hip Pain    left    47 year old female with painful left hip leg and lower back thought to have avascular necrosis of the hip  However patient does not have any groin or thigh pain anteriorly she does have lower back pain radiates to the left knee  She has had physical therapy did not improve she is also been on ibuprofen duloxetine Robaxin and meloxicam with no improvement     Review of Systems  Gastrointestinal: Negative.   Genitourinary: Negative.    Current Meds  Medication Sig  . ibuprofen (ADVIL,MOTRIN) 800 MG tablet Take 1 tablet (800 mg total) by mouth every 8 (eight) hours as needed.    Allergies  Allergen Reactions  . Aspirin Other (See Comments)    Bloody stools      BP (!) 123/59   Pulse 73   Ht 5\' 9"  (1.753 m)   Wt 287 lb (130.2 kg)   BMI 42.38 kg/m   Physical Exam  Constitutional: She is oriented to person, place, and time. She appears well-developed and well-nourished.  Musculoskeletal:       Arms: Neurological: She is alert and oriented to person, place, and time.  Psychiatric: She has a normal mood and affect. Judgment normal.  Vitals reviewed.    Medical decision-making Encounter Diagnosis  Name Primary?  . Left-sided low back pain with left-sided sciatica, unspecified chronicity Yes    Recommend MRI scan lower back and then we can read it and refer to neurosurgery if needed.  Otherwise continue Tylenol exercise and keep her weight down Angela CanadaStanley Eldonna Neuenfeldt, MD 12/17/2017 4:46 PM

## 2018-01-01 ENCOUNTER — Telehealth: Payer: Self-pay | Admitting: Radiology

## 2018-01-01 ENCOUNTER — Telehealth: Payer: Self-pay | Admitting: Orthopedic Surgery

## 2018-01-01 NOTE — Telephone Encounter (Signed)
Patient returned my call, she did have the MRI on 12/29/17 I have called her again, to advise Kathie RhodesBetty has given me the report, and she needs appointment to review the scan. Left message for her to call us back and make appointment.

## 2018-01-01 NOTE — Telephone Encounter (Signed)
Called patient again about MRI scheduling she has not returned my calls to schedule the study. Left message for her to call me back and let me know if she plans to go or not.

## 2018-01-01 NOTE — Telephone Encounter (Signed)
Patient aware of appointment scheduled for review of MRI results (01/06/18) and relays she is hurting. States that Ibuprofen does not help.  Asking if she may have something else prescribed.  [facility] if so

## 2018-01-01 NOTE — Telephone Encounter (Signed)
[name] is talking to her now. Has offered tomorrow patient has declined

## 2018-01-01 NOTE — Telephone Encounter (Signed)
We will see her tomorrow

## 2018-01-01 NOTE — Telephone Encounter (Signed)
Patient offered appointment for tomorrow with Dr Romeo AppleHarrison as noted - patient unable to, due to work. States will stay with originally scheduled follow up visit, 01/06/18. Voices understanding as to await appointment to discuss medication.

## 2018-01-06 ENCOUNTER — Ambulatory Visit: Payer: BC Managed Care – PPO | Admitting: Orthopedic Surgery

## 2018-01-06 VITALS — BP 152/98 | HR 77 | Ht 69.0 in | Wt 283.0 lb

## 2018-01-06 DIAGNOSIS — M5442 Lumbago with sciatica, left side: Secondary | ICD-10-CM | POA: Diagnosis not present

## 2018-01-06 MED ORDER — NABUMETONE 500 MG PO TABS
500.0000 mg | ORAL_TABLET | Freq: Two times a day (BID) | ORAL | 5 refills | Status: DC
Start: 2018-01-06 — End: 2018-04-19

## 2018-01-06 NOTE — Progress Notes (Signed)
Chief Complaint  Patient presents with  . Follow-up    MRi results of L-spine.    47 years old left hip arthritis and avascular necrosis but with no groin pain or anterior thigh pain worked up for back pain had physical therapy MRI showed posterior disc bulges and facet arthrosis  She says that the meloxicam ibuprofen Robaxin has not helped    Review of Systems  Gastrointestinal: Negative.   Genitourinary: Negative.   Neurological: Negative for tingling.   Most of her pain is on the left lower back and lateral side left leg occasional radiation left knee    MRI report was read as  Levels: T12-L1: No significant stenosis. L1-L2: No significant stenosis. L2-L3: There is a minimal broad-based posterior disc bulge and minimal stenosis of the central canal. The foramina are patent. L3-L4: Minimal posterior disc bulge and minimal canal stenosis. Patent foramina. L4-L5: Minimal posterior disc bulge and bilateral facet arthrosis. No significant canal or foraminal stenosis. L5-S1: Mild facet arthrosis bilaterally. No significant stenosis.   IMPRESSION: 1.  No acute findings.  2.   Minimal disc bulges and lower lumbar facet arthrosis. No high-grade canal or foraminal stenosis throughout.  Meds ordered this encounter  Medications  . nabumetone (RELAFEN) 500 MG tablet    Sig: Take 1 tablet (500 mg total) by mouth 2 (two) times daily.    Dispense:  60 tablet    Refill:  5   I will have her see Dr. Hilda LiasKeeling in follow-up she does not need surgery at this time she did not want to do epidural injections at this time

## 2018-01-06 NOTE — Patient Instructions (Signed)

## 2018-01-08 ENCOUNTER — Telehealth: Payer: Self-pay | Admitting: Orthopedic Surgery

## 2018-01-08 DIAGNOSIS — M5442 Lumbago with sciatica, left side: Principal | ICD-10-CM

## 2018-01-08 DIAGNOSIS — M5441 Lumbago with sciatica, right side: Secondary | ICD-10-CM

## 2018-01-08 NOTE — Addendum Note (Signed)
Addended byCaffie Damme: Jase Reep W on: 01/08/2018 03:40 PM   Modules accepted: Orders

## 2018-01-08 NOTE — Telephone Encounter (Signed)
Tylenol.

## 2018-01-08 NOTE — Telephone Encounter (Signed)
Yes if mri is within 2 yrs

## 2018-01-08 NOTE — Telephone Encounter (Signed)
[name] called and stated that she doesn't feel that the Relafen is helping.  She would like to get something else  She [facility] [place]

## 2018-01-08 NOTE — Telephone Encounter (Signed)
I called told her to give the Relafen more time and if it does not help then change to Tylenol

## 2018-01-08 NOTE — Telephone Encounter (Signed)
MRI was 12/30/17 put in referral called patient provided number for her to [facility]

## 2018-01-08 NOTE — Telephone Encounter (Signed)
She wants to know if she can try steroid injections

## 2018-01-09 ENCOUNTER — Other Ambulatory Visit: Payer: Self-pay | Admitting: Orthopedic Surgery

## 2018-01-09 DIAGNOSIS — M5442 Lumbago with sciatica, left side: Principal | ICD-10-CM

## 2018-01-09 DIAGNOSIS — M5441 Lumbago with sciatica, right side: Secondary | ICD-10-CM

## 2018-01-23 ENCOUNTER — Ambulatory Visit
Admission: RE | Admit: 2018-01-23 | Discharge: 2018-01-23 | Disposition: A | Discharge status: Home / Self Care | Payer: BC Managed Care – PPO | Source: Ambulatory Visit | Attending: Orthopedic Surgery | Admitting: Orthopedic Surgery

## 2018-01-23 DIAGNOSIS — M5442 Lumbago with sciatica, left side: Principal | ICD-10-CM

## 2018-01-23 DIAGNOSIS — M5441 Lumbago with sciatica, right side: Secondary | ICD-10-CM

## 2018-01-23 MED ORDER — METHYLPREDNISOLONE ACETATE 40 MG/ML INJ SUSP (RADIOLOG
120.0000 mg | Freq: Once | INTRAMUSCULAR | Status: AC
  Administered 2018-01-23: 120 mg via EPIDURAL

## 2018-01-23 MED ORDER — IOPAMIDOL (ISOVUE-M 200) INJECTION 41%
1.0000 mL | Freq: Once | INTRAMUSCULAR | Status: AC
  Administered 2018-01-23: 1 mL via EPIDURAL

## 2018-01-23 NOTE — Discharge Instructions (Signed)

## 2018-03-13 ENCOUNTER — Encounter: Payer: Self-pay | Admitting: Family Medicine

## 2018-04-07 ENCOUNTER — Ambulatory Visit: Payer: BC Managed Care – PPO | Admitting: Orthopaedic Surgery

## 2018-04-19 ENCOUNTER — Emergency Department (HOSPITAL_COMMUNITY)
Admission: EM | Admit: 2018-04-19 | Discharge: 2018-04-19 | Disposition: A | Discharge status: Home / Self Care | Payer: BC Managed Care – PPO | Attending: Emergency Medicine | Admitting: Emergency Medicine

## 2018-04-19 ENCOUNTER — Other Ambulatory Visit: Payer: Self-pay

## 2018-04-19 ENCOUNTER — Encounter (HOSPITAL_COMMUNITY): Payer: Self-pay | Admitting: Emergency Medicine

## 2018-04-19 DIAGNOSIS — F419 Anxiety disorder, unspecified: Secondary | ICD-10-CM

## 2018-04-19 DIAGNOSIS — F332 Major depressive disorder, recurrent severe without psychotic features: Secondary | ICD-10-CM | POA: Insufficient documentation

## 2018-04-19 DIAGNOSIS — Z87891 Personal history of nicotine dependence: Secondary | ICD-10-CM | POA: Diagnosis not present

## 2018-04-19 LAB — CBC WITH DIFFERENTIAL/PLATELET
BASOS ABS: 0 10*3/uL (ref 0.0–0.1)
BASOS PCT: 1 %
EOS ABS: 0.4 10*3/uL (ref 0.0–0.7)
Eosinophils Relative: 7 %
HEMATOCRIT: 38.8 % (ref 36.0–46.0)
Hemoglobin: 13 g/dL (ref 12.0–15.0)
Lymphocytes Relative: 25 %
Lymphs Abs: 1.4 10*3/uL (ref 0.7–4.0)
MCH: 29.7 pg (ref 26.0–34.0)
MCHC: 33.5 g/dL (ref 30.0–36.0)
MCV: 88.8 fL (ref 78.0–100.0)
MONO ABS: 0.4 10*3/uL (ref 0.1–1.0)
MONOS PCT: 7 %
NEUTROS ABS: 3.4 10*3/uL (ref 1.7–7.7)
Neutrophils Relative %: 62 %
Platelets: 368 10*3/uL (ref 150–400)
RBC: 4.37 MIL/uL (ref 3.87–5.11)
RDW: 12.9 % (ref 11.5–15.5)
WBC: 5.5 10*3/uL (ref 4.0–10.5)

## 2018-04-19 LAB — RAPID URINE DRUG SCREEN, HOSP PERFORMED
Amphetamines: NOT DETECTED
Benzodiazepines: NOT DETECTED
COCAINE: NOT DETECTED
Opiates: NOT DETECTED
TETRAHYDROCANNABINOL: NOT DETECTED

## 2018-04-19 LAB — BASIC METABOLIC PANEL
ANION GAP: 8 (ref 5–15)
BUN: 11 mg/dL (ref 6–20)
CALCIUM: 8.8 mg/dL — AB (ref 8.9–10.3)
CO2: 26 mmol/L (ref 22–32)
CREATININE: 0.82 mg/dL (ref 0.44–1.00)
Chloride: 107 mmol/L (ref 98–111)
GFR calc non Af Amer: >60 mL/min (ref >60)
Glucose, Bld: 111 mg/dL — ABNORMAL HIGH (ref 70–99)
Potassium: 3 mmol/L — ABNORMAL LOW (ref 3.5–5.1)
SODIUM: 141 mmol/L (ref 135–145)

## 2018-04-19 LAB — SALICYLATE LEVEL: Salicylate Lvl: <7 mg/dL (ref 2.8–30.0)

## 2018-04-19 LAB — ACETAMINOPHEN LEVEL: Acetaminophen (Tylenol), Serum: <10 ug/mL — ABNORMAL LOW (ref 10–30)

## 2018-04-19 LAB — ETHANOL: Alcohol, Ethyl (B): <10 mg/dL (ref <10)

## 2018-04-19 MED ORDER — HYDROXYZINE HCL 25 MG PO TABS: 25.0000 mg | ORAL_TABLET | Freq: Four times a day (QID) | ORAL | 0 refills | Status: AC | PRN

## 2018-04-19 NOTE — Progress Notes (Signed)
Patient is seen by me via tele-psych today and I have consulted with Dr. Lucianne MussKumar.  Patient denies any current SI/HI/AVH and currently contracts for safety.  Patient states that she does have some fleeting moments of SI but no specific plan.  She states that she lives with her mother and has a support system, but she has not really discussed this issue with her mother.  She states that she came to the ED so that she could get some medication to ease her mind.  Patient states agreement to [facility] and will attempt to get into their IOP program.  Patient does not meet criteria for inpatient treatment and is psychiatrically cleared.  I contacted Dr. Hyacinth MeekerMiller at [facility] ED and notified him of our recommendations.  Dr. Hyacinth MeekerMiller and I also came to the agreement of prescribing the patient Vistaril for her anxiety and to help her with some sleep.  Patient did agree to safety plan to return to the hospital if symptoms worsened or more prominent SI presented.

## 2018-04-19 NOTE — BH Assessment (Signed)
Tele Assessment Note   Patient Name: [name] MRN: [id number] Referring Physician: Hyacinth Meeker Location of Patient: [facility] Location of Provider: Behavioral Health TTS Department  [name] is an 47 y.o. female who presents voluntarily reporting primary symptoms of social withdrawal, loss of interest in usual pleasures, decreased concentration, fatigue, irritability, decreased sleep, decreased appetite and anxiety relating to stressors with her 69 yo son who is in and out of the home. Pt denies current SI, but has "had thoughts off and on" including admitting to researching plans "about a month or two ago". She denies developing a plan or ever having intent.I, HI, psychosis, SA. PT describes 0 past attempts, no history of violence. Pt states that onset of symptoms has been going on for several months off and on.   Pt identifies primary stressors as issues related to her son.  Pt identifies primary residence as with her mother. Pt identifies primary supports as her mother. Pt denies SA. 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 Pt denies legal involvement. Pt denies abuse history.  Pt identifies previous treatment as OP at [facility], but has not been in 2 year due to medications costing too much.  Pt describes no significant family MH/SA history.  Pt has fair insight and judgment. Pt's memory is typical .? ? MSE: Pt is casually dressed, alert, oriented x4 with normal speech and normal motor behavior. Eye contact is good. Pt's mood is depressed and affect is depressed and anxious. Affect is congruent with mood. Thought process is coherent and relevant. There is no indication that pt is currently responding to internal stimuli or experiencing delusional thought content. Pt was cooperative throughout assessment.   Reola Calkins, NP, recommended outtpatient psychiatric treatment at [facility] IOP. Pt agrees to go tomorrow. Notified  EDP/staff.  Protective factors against suicide include (no plan or intent, good family support, children in the home, future orientations, no access to firearms, no prior attempts) -OP referrals given -Pt has suicide prevention information including 24 hr. Crisis numbers -verbal agreement that pt will contact crisis numbers if feeling unsafe.    Diagnosis: Primary Mental Health  F33.2 MDD recurrent severe, without psychosis    Past Medical History:  Past Medical History:  Diagnosis Date  . Anxiety   . Depression   . HLD (hyperlipidemia) 02/24/2017  . Primary osteoarthritis of both knees 01/23/2017    Past Surgical History:  Procedure Laterality Date  . POLYPECTOMY    . TUBAL LIGATION      Family History:  Family History  Problem Relation Age of Onset  . Diabetes Mother   . Hypertension Mother   . Stroke Father   . Dementia Father   . Early death Brother        MVA    Social History:  reports that she has quit smoking. She has never used smokeless tobacco. She reports that she does not drink alcohol or use drugs.  Additional Social History:  Alcohol / Drug Use Pain Medications: denies Prescriptions: denies Over the Counter: denies History of alcohol / drug use?: No history of alcohol / drug abuse Longest period of sobriety (when/how long): denies Negative Consequences of Use: (denies) Withdrawal Symptoms: (denies)  CIWA: CIWA-Ar BP: 123/71 Pulse Rate: 80 COWS:    Allergies:  Allergies  Allergen Reactions  . Aspirin Other (See Comments)    Bloody stools     Home Medications:  (Not in a hospital admission)  OB/GYN Status:  Patient's last menstrual period was 04/18/2018.  General Assessment Data Location of Assessment: [facility] TTS Assessment: In system Is this a Tele or Face-to-Face Assessment?: Tele Assessment Is this an Initial Assessment or a Re-assessment for this encounter?: Initial Assessment Marital status: Single Is patient pregnant?:  No Pregnancy Status: No Living Arrangements: Parent, Children Can pt return to current living arrangement?: Yes Admission Status: Voluntary Is patient capable of signing voluntary admission?: Yes Referral Source: Self/Family/Friend Insurance type: BCBS     Crisis Care Plan Living Arrangements: Parent, Children Name of Psychiatrist: [facility] Name of Therapist: [facility]  Education Status Is patient currently in school?: No Is the patient employed, unemployed or receiving disability?: Employed  Risk to self with the past 6 months Suicidal Ideation: No-Not Currently/Within Last 6 Months Has patient been a risk to self within the past 6 months prior to admission? : No Suicidal Intent: No Has patient had any suicidal intent within the past 6 months prior to admission? : No Is patient at risk for suicide?: Yes Suicidal Plan?: No Has patient had any suicidal plan within the past 6 months prior to admission? : No Access to Means: No What has been your use of drugs/alcohol within the last 12 months?: (denies) Previous Attempts/Gestures: No Intentional Self Injurious Behavior: None Family Suicide History: No Recent stressful life event(s): Conflict (Comment), Loss (Comment)(with son, losses in the comunity) Persecutory voices/beliefs?: No Depression: Yes Depression Symptoms: Insomnia, Isolating, Fatigue, Loss of interest in usual pleasures, Feeling worthless/self pity Substance abuse history and/or treatment for substance abuse?: No Suicide prevention information given to non-admitted patients: Yes  Risk to Others within the past 6 months Homicidal Ideation: No Does patient have any lifetime risk of violence toward others beyond the six months prior to admission? : No Thoughts of Harm to Others: No Current Homicidal Intent: No Current Homicidal Plan: No Access to Homicidal Means: No History of harm to others?: No Assessment of Violence: None Noted Violent Behavior Description:  none Does patient have access to weapons?: No Criminal Charges Pending?: No Does patient have a court date: No Is patient on probation?: No  Psychosis Hallucinations: None noted Delusions: None noted  Mental Status Report Appearance/Hygiene: Unremarkable Eye Contact: Good Motor Activity: Unremarkable Speech: Logical/coherent Level of Consciousness: Alert Mood: Depressed, Anxious Affect: Anxious, Depressed Anxiety Level: Severe Thought Processes: Coherent, Relevant Judgement: Partial Orientation: Person, Place, Time, Situation, Appropriate for developmental age Obsessive Compulsive Thoughts/Behaviors: None  Cognitive Functioning Concentration: Fair Memory: Remote Intact, Recent Intact Is patient IDD: No Is patient DD?: No Insight: Fair Impulse Control: Good Appetite: Poor Have you had any weight changes? : No Change Sleep: Decreased Total Hours of Sleep: 5 Vegetative Symptoms: Staying in bed  ADLScreening [facility] Assessment Services) Patient's cognitive ability adequate to safely complete daily activities?: Yes Patient able to express need for assistance with ADLs?: Yes Independently performs ADLs?: Yes (appropriate for developmental age)  Prior Inpatient Therapy Prior Inpatient Therapy: No  Prior Outpatient Therapy Prior Outpatient Therapy: Yes Prior Therapy Dates: 2 years ago Prior Therapy Facilty/Provider(s): [facility] Reason for Treatment: depression Does patient have an ACCT team?: No Does patient have Intensive In-House Services?  : No Does patient have [facility] services? : No Does patient have P4CC services?: No  ADL Screening (condition at time of admission) Patient's cognitive ability adequate to safely complete daily activities?: Yes Is the patient deaf or have difficulty hearing?: No Does the patient have difficulty seeing, even when wearing glasses/contacts?: No Does the patient have difficulty concentrating, remembering,  or making decisions?:  No Patient able to express need for assistance with ADLs?: Yes Does the patient have difficulty dressing or bathing?: No Independently performs ADLs?: Yes (appropriate for developmental age) Does the patient have difficulty walking or climbing stairs?: No Weakness of Legs: None Weakness of Arms/Hands: None  Home Assistive Devices/Equipment Home Assistive Devices/Equipment: None  Therapy Consults (therapy consults require a physician order) PT Evaluation Needed: No OT Evalulation Needed: No SLP Evaluation Needed: No Abuse/Neglect Assessment (Assessment to be complete while patient is alone) Abuse/Neglect Assessment Can Be Completed: Yes Physical Abuse: Denies Verbal Abuse: Denies Sexual Abuse: Denies Exploitation of patient/patient's resources: Denies Self-Neglect: Denies Values / Beliefs Cultural Requests During Hospitalization: None Spiritual Requests During Hospitalization: None Consults Spiritual Care Consult Needed: No Social Work Consult Needed: No Merchant navy officerAdvance Directives (For Healthcare) Does Patient Have a Medical Advance Directive?: No Would patient like information on creating a medical advance directive?: No - Patient declined          Disposition:  Disposition Initial Assessment Completed for this Encounter: Yes Patient referred to: ([facility])  This service was provided via telemedicine using a 2-way, interactive audio and Immunologistvideo technology.  Names of all persons participating in this telemedicine service and their role in this encounter.               Sabirin Baray Hines 04/19/2018 4:02 PM

## 2018-04-19 NOTE — ED Provider Notes (Signed)
[facility] EMERGENCY DEPARTMENT Provider Note   CSN: [id number] Arrival date & time: 04/19/18  1414     History   Chief Complaint Chief Complaint  Patient presents with  . Anxiety  . V70.1    HPI [name] is a 47 y.o. female.  HPI  47 year old female, comments on a history of arthritis, otherwise has struggled mostly with depression and anxiety in fact in the past she had followed with the local [facility] where she was prescribed multiple different medications none of which worked followed by Wellbutrin which she did take for several years but because of her ongoing depression and feeling like the medication did not help she gradually tapered herself off of this medication and has not had it in over 2 years.  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  She reports that most of these problems center around dealing with her 98 year old son who lives with her, she reports that he causes significant amounts of anxiety based on his behavior, his interaction with family, the fact that he calls the house at all hours asking for help when he gets into difficult situations, he is disrespectful, has been verbally becoming more aggressive and disrespectful and this puts additional stress and anxiety on her.  She reports that she has no paranoia, no hallucinations, she has no physical complaints including chest pain shortness of breath back pain headache nausea vomiting or diarrhea.  She does report that she has had some suicidal thoughts stating that if she was not here anymore she would not have to do with him.  She is tired of feeling down and just wants to feel better.  She has  actually researched online how to kill herself, she has done this several times but states she has never made an attempt on her life.  She does not currently take any daily medications.  Past Medical History:  Diagnosis Date  . Anxiety   . Depression   . HLD (hyperlipidemia) 02/24/2017  . Primary osteoarthritis of both knees 01/23/2017    Patient Active Problem List   Diagnosis Date Noted  . Vitamin D deficiency 02/24/2017  . HLD (hyperlipidemia) 02/24/2017  . Morbid obesity (HCC) 01/23/2017  . Depression with anxiety 01/23/2017  . Primary osteoarthritis of both hips 01/23/2017  . Primary osteoarthritis of both knees 01/23/2017  . Simple hepatic cyst 01/23/2017    Past Surgical History:  Procedure Laterality Date  . POLYPECTOMY    . TUBAL LIGATION       OB History   None      Home Medications    Prior to Admission medications   Medication Sig Start Date End Date Taking? Authorizing Provider  ibuprofen (ADVIL,MOTRIN) 800 MG tablet Take 1 tablet (800 mg total) by mouth every 8 (eight) hours as needed. 11/13/17  Yes Eustace Moore, MD  hydrOXYzine (ATARAX/VISTARIL) 25 MG tablet Take 1 tablet (25 mg total) by mouth every 6 (six) hours as needed for up to 5 days for anxiety. 04/19/18 04/24/18  Eber Hong, MD    Family History Family History  Problem Relation Age of Onset  . Diabetes Mother   . Hypertension Mother   .  Stroke Father   . Dementia Father   . Early death Brother        MVA    Social History Social History   Tobacco Use  . Smoking status: Former Games developer  . Smokeless tobacco: Never Used  Substance Use Topics  . Alcohol use: No  . Drug use: No     Allergies   Aspirin   Review of Systems Review of Systems  All other systems reviewed and are negative.    Physical Exam Updated Vital Signs BP 123/71 (BP Location: Right Arm)   Pulse 80   Temp 98.2 F (36.8 C) (Oral)   Resp 14   Ht 5\' 9"  (1.753 m)   Wt 127 kg (280 lb)   LMP 04/18/2018    SpO2 98%   BMI 41.35 kg/m   Physical Exam  Constitutional: She appears well-developed and well-nourished. No distress.  HENT:  Head: Normocephalic and atraumatic.  Mouth/Throat: Oropharynx is clear and moist. No oropharyngeal exudate.  Eyes: Pupils are equal, round, and reactive to light. Conjunctivae and EOM are normal. Right eye exhibits no discharge. Left eye exhibits no discharge. No scleral icterus.  Neck: Normal range of motion. Neck supple. No JVD present. No thyromegaly present.  Cardiovascular: Normal rate, regular rhythm, normal heart sounds and intact distal pulses. Exam reveals no gallop and no friction rub.  No murmur heard. Pulmonary/Chest: Effort normal and breath sounds normal. No respiratory distress. She has no wheezes. She has no rales.  Abdominal: Soft. Bowel sounds are normal. She exhibits no distension and no mass. There is no tenderness.  Musculoskeletal: Normal range of motion. She exhibits no edema or tenderness.  Lymphadenopathy:    She has no cervical adenopathy.  Neurological: She is alert. Coordination normal.  Skin: Skin is warm and dry. No rash noted. No erythema.  Psychiatric:  Sad affect, no internal stimuli, endorses passive suicidal ideation without a definite plan  Nursing note and vitals reviewed.    ED Treatments / Results  Labs (all labs ordered are listed, but only abnormal results are displayed) Labs Reviewed  BASIC METABOLIC PANEL - Abnormal; Notable for the following components:      Result Value   Potassium 3.0 (*)    Glucose, Bld 111 (*)    Calcium 8.8 (*)    All other components within normal limits  RAPID URINE DRUG SCREEN, HOSP PERFORMED - Abnormal; Notable for the following components:   Barbiturates   (*)    Value: Result not available. Reagent lot number recalled by manufacturer.   All other components within normal limits  ACETAMINOPHEN LEVEL - Abnormal; Notable for the following components:   Acetaminophen (Tylenol),  Serum <10 (*)    All other components within normal limits  CBC WITH DIFFERENTIAL/PLATELET  ETHANOL  SALICYLATE LEVEL    EKG None  Radiology No results found.  Procedures Procedures (including critical care time)  Medications Ordered in ED Medications - No data to display   Initial Impression / Assessment and Plan / ED Course  I have reviewed the triage vital signs and the nursing notes.  Pertinent labs & imaging results that were available during my care of the patient were reviewed by me and considered in my medical decision making (see chart for details).     The patient denies having tried to hurt her self.  She appears to be giving me a very clear history.  She is distraught, she is anxious, she is considering suicide and multiple times has  researched this raising concern for decompensated condition.  When asked if she would actually hurt herself she states "I do not think so but I am not sure."  I will consult with behavioral health, the patient clinically appears to be in no medical distress with normal vital signs.  No abnormal findings on exam to suggest an alternative cause of her symptoms other than her chronic depression and worsening anxiety with suicidal thoughts.  There is no family history of significant mental illness, suicide or substance abuse.  The patient was seen and examined by the behavioral health team who agreed that the patient is stable for discharge, she does not endorse any active suicidal plan and states that these thoughts are fleeting when she gets upset about her son.  She feels like she [facility] setting.  I think this is okay, she will be given Vistaril for home.  Patient  able to explain the reasons for return and agreeable.  Final Clinical Impressions(s) / ED Diagnoses   Final diagnoses:  Anxiety    ED Discharge Orders        Ordered    hydrOXYzine (ATARAX/VISTARIL) 25 MG  tablet  Every 6 hours PRN     04/19/18 1653       Eber HongMiller, Argenis Kumari, MD 04/19/18 1654

## 2018-04-19 NOTE — Discharge Instructions (Signed)
[facility], first thing in the morning to establish or reestablish care and to discuss her anxiety.  If you should develop worsening suicidal thoughts, return to the emergency department immediately.

## 2018-04-19 NOTE — ED Triage Notes (Signed)
Pt states that she is having a lot of anxiety and thoughts of hurting herself.

## 2018-04-19 NOTE — ED Notes (Signed)
TTS in process 

## 2018-05-22 ENCOUNTER — Other Ambulatory Visit: Payer: Self-pay | Admitting: Family Medicine

## 2018-05-22 DIAGNOSIS — Z1231 Encounter for screening mammogram for malignant neoplasm of breast: Secondary | ICD-10-CM

## 2018-06-01 ENCOUNTER — Ambulatory Visit (HOSPITAL_COMMUNITY)
Admission: RE | Admit: 2018-06-01 | Discharge: 2018-06-01 | Disposition: A | Discharge status: Home / Self Care | Payer: BC Managed Care – PPO | Source: Ambulatory Visit | Attending: Family Medicine | Admitting: Family Medicine

## 2018-06-01 DIAGNOSIS — Z1231 Encounter for screening mammogram for malignant neoplasm of breast: Secondary | ICD-10-CM | POA: Diagnosis not present

## 2018-07-16 IMAGING — XA Imaging study
2 series · 2 of 2 positions shown · non-contrast
Comparison: none

CLINICAL DATA: Spondylosis without myelopathy. Left hip and thigh
pain, numbness and tingling.

[Series 1: ortho adipose · 1 of 1 slices shown (1 of 2)]
[im 1/1]
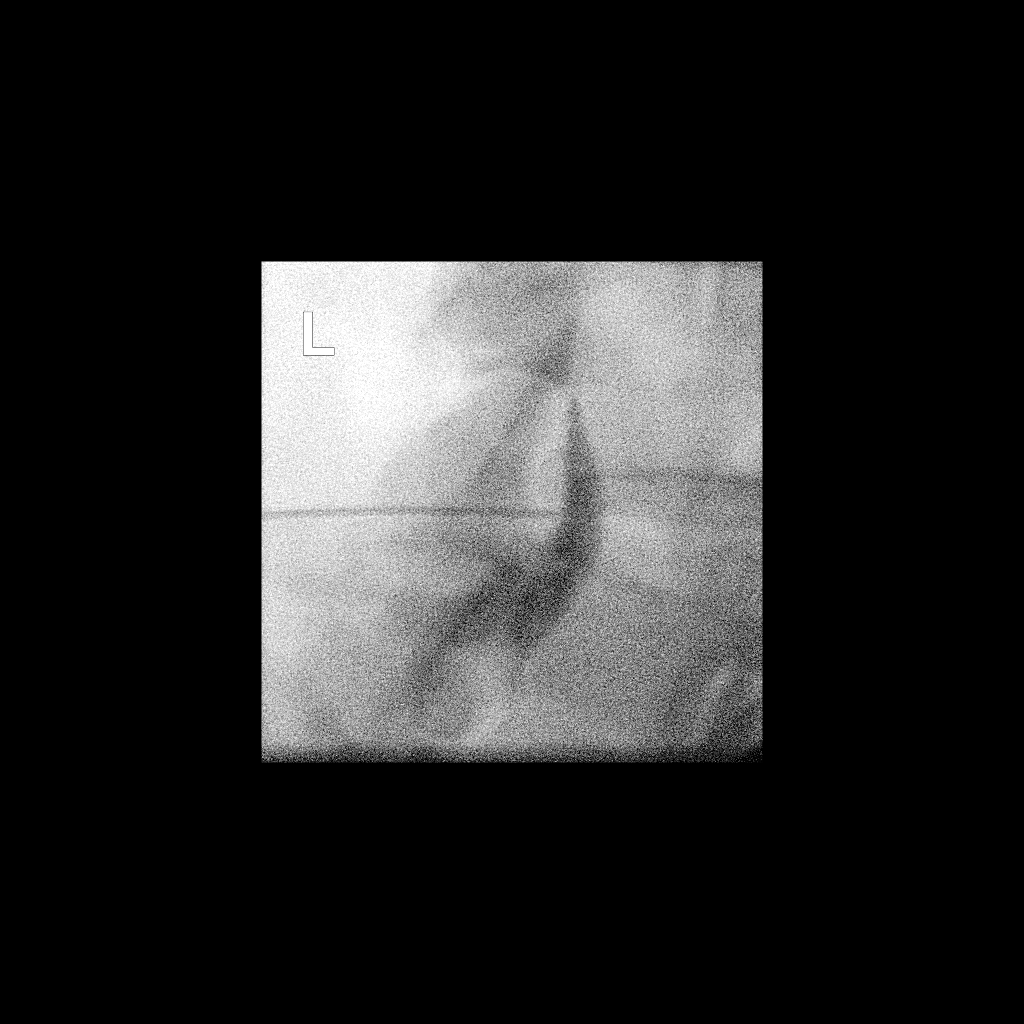

[Series 2: ortho adipose · 1 of 1 slices shown (2 of 2)]
[im 1/1]
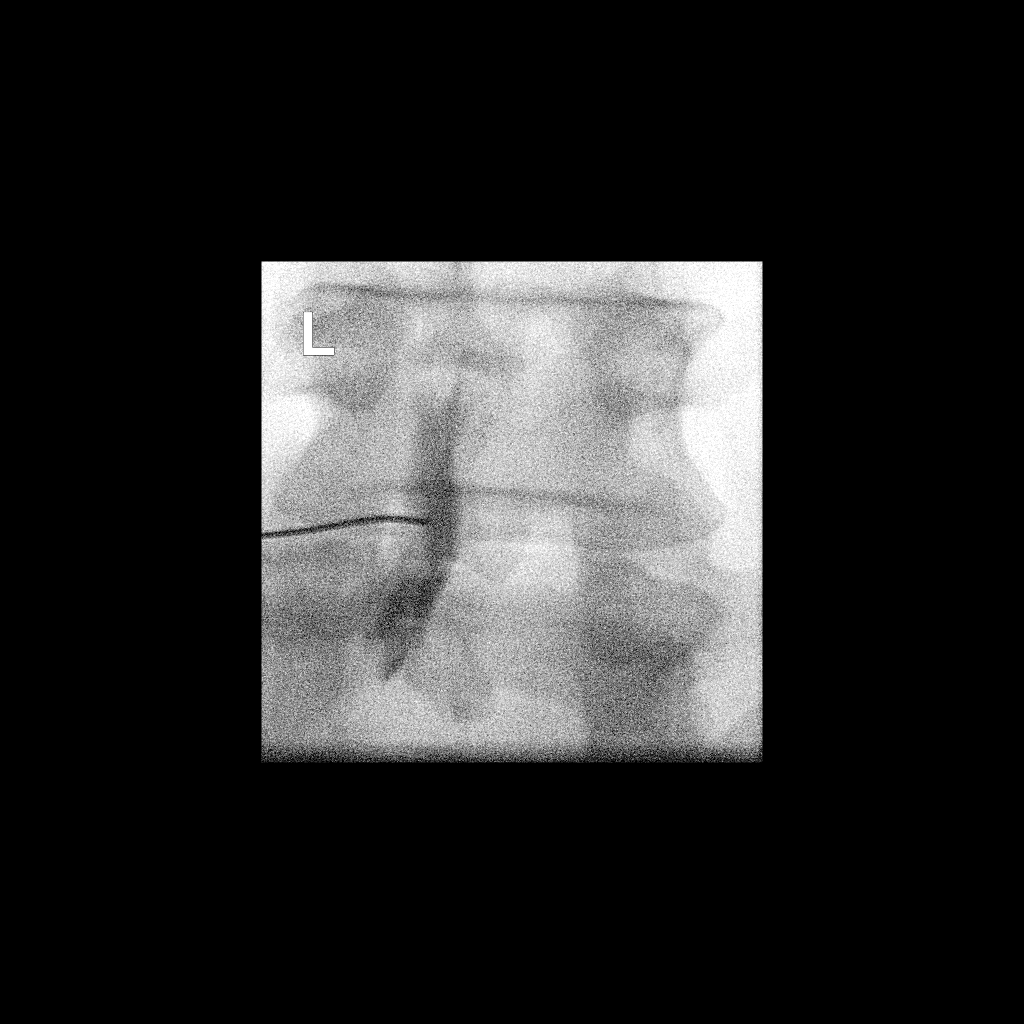

[2 of 2 positions shown; findings below may reference images not displayed]

FLUOROSCOPY TIME:  0 minutes 42 seconds. 50.88 micro gray meter
squared

PROCEDURE:
The procedure, risks, benefits, and alternatives were explained to
the patient. Questions regarding the procedure were encouraged and
answered. The patient understands and consents to the procedure.

LUMBAR EPIDURAL INJECTION:

An interlaminar approach was performed on the left at L4-5. The
overlying skin was cleansed and anesthetized. A 20 gauge 6 inch
epidural needle was advanced using loss-of-resistance technique.

DIAGNOSTIC EPIDURAL INJECTION:

Injection of Isovue-M 200 shows a good epidural pattern with spread
above and below the level of needle placement, primarily on the
left. No vascular opacification is seen.

THERAPEUTIC EPIDURAL INJECTION:

One hundred twenty mg of Depo-Medrol mixed with 3 cc 1% lidocaine
were instilled. The procedure was well-tolerated, and the patient
was discharged thirty minutes following the injection in good
condition.

COMPLICATIONS:
None
IMPRESSION: Technically successful epidural injection on the left at L4-5 # 1. 6
inch needle necessary.

## 2019-08-11 ENCOUNTER — Other Ambulatory Visit (HOSPITAL_COMMUNITY): Payer: Self-pay | Admitting: Internal Medicine

## 2019-08-11 DIAGNOSIS — Z1231 Encounter for screening mammogram for malignant neoplasm of breast: Secondary | ICD-10-CM

## 2019-08-18 ENCOUNTER — Ambulatory Visit (HOSPITAL_COMMUNITY)
Admission: RE | Admit: 2019-08-18 | Discharge: 2019-08-18 | Disposition: A | Discharge status: Home / Self Care | Payer: BC Managed Care – PPO | Source: Ambulatory Visit | Attending: Internal Medicine | Admitting: Internal Medicine

## 2019-08-18 ENCOUNTER — Other Ambulatory Visit: Payer: Self-pay

## 2019-08-18 DIAGNOSIS — Z1231 Encounter for screening mammogram for malignant neoplasm of breast: Secondary | ICD-10-CM | POA: Insufficient documentation

## 2020-01-03 ENCOUNTER — Other Ambulatory Visit: Payer: BC Managed Care – PPO

## 2020-01-05 ENCOUNTER — Other Ambulatory Visit: Payer: Self-pay

## 2020-01-05 ENCOUNTER — Ambulatory Visit: Payer: BC Managed Care – PPO | Attending: Internal Medicine

## 2020-01-05 DIAGNOSIS — Z20822 Contact with and (suspected) exposure to covid-19: Secondary | ICD-10-CM

## 2020-01-06 LAB — NOVEL CORONAVIRUS, NAA: SARS-CoV-2, NAA: NOT DETECTED

## 2020-03-10 NOTE — Telephone Encounter (Signed)
Error

## 2020-03-10 NOTE — Telephone Encounter (Signed)
error 

## 2020-08-25 ENCOUNTER — Other Ambulatory Visit (HOSPITAL_COMMUNITY): Payer: Self-pay | Admitting: Internal Medicine

## 2020-08-25 DIAGNOSIS — Z1231 Encounter for screening mammogram for malignant neoplasm of breast: Secondary | ICD-10-CM

## 2020-08-30 ENCOUNTER — Other Ambulatory Visit: Payer: Self-pay

## 2020-08-30 ENCOUNTER — Ambulatory Visit (HOSPITAL_COMMUNITY)
Admission: RE | Admit: 2020-08-30 | Discharge: 2020-08-30 | Disposition: A | Discharge status: Home / Self Care | Payer: BC Managed Care – PPO | Source: Ambulatory Visit | Attending: Internal Medicine | Admitting: Internal Medicine

## 2020-08-30 DIAGNOSIS — Z1231 Encounter for screening mammogram for malignant neoplasm of breast: Secondary | ICD-10-CM

## 2021-02-20 IMAGING — MG DIGITAL SCREENING BILAT W/ TOMO W/ CAD
6 of 10 series · 6 of 30 positions shown · non-contrast
Comparison: Previous exam(s).

ACR Breast Density Category a: The breast tissue is almost entirely
fatty.

CLINICAL DATA: Screening.

EXAM:
DIGITAL SCREENING BILATERAL MAMMOGRAM WITH TOMO AND CAD

[L MLO synth-2D]
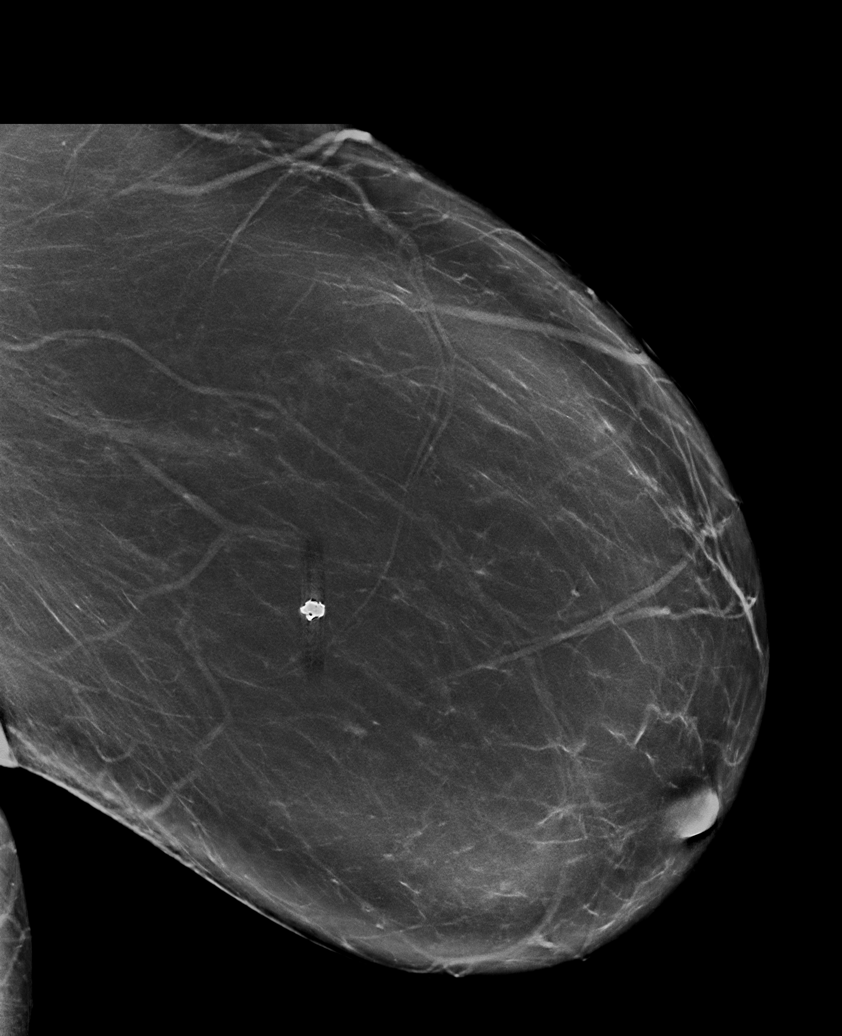

[R MLO synth-2D (1 of 2)]
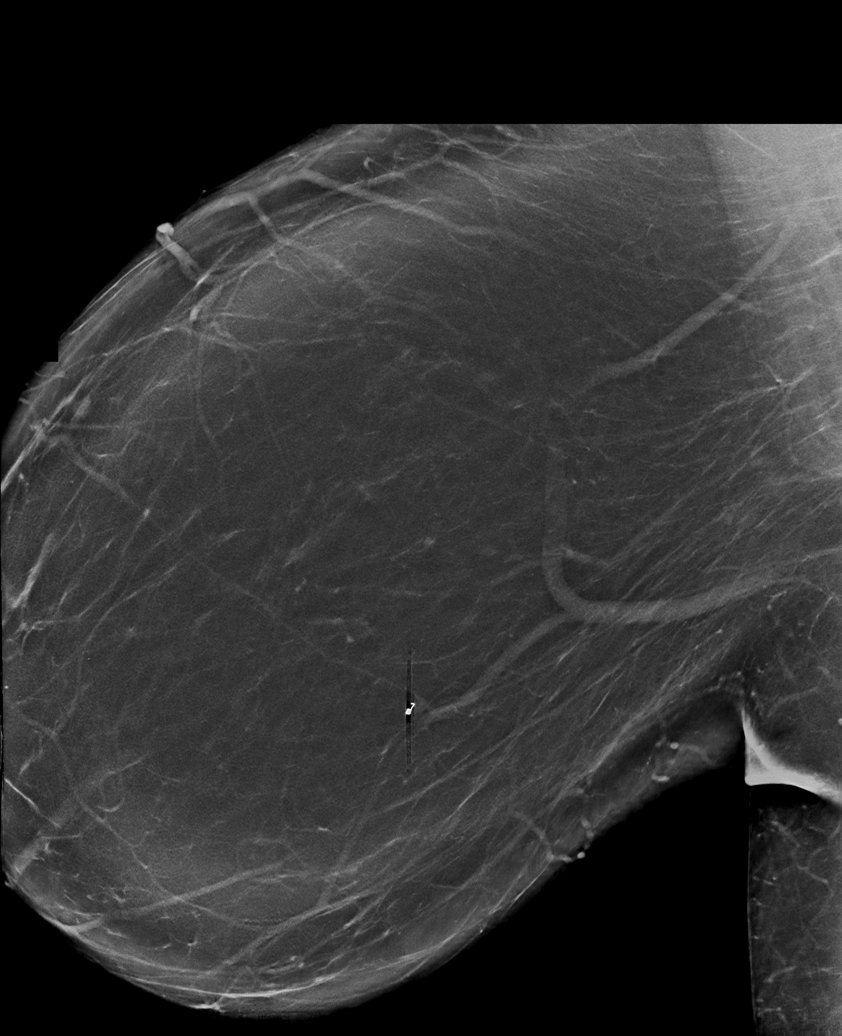

[R CC synth-2D]
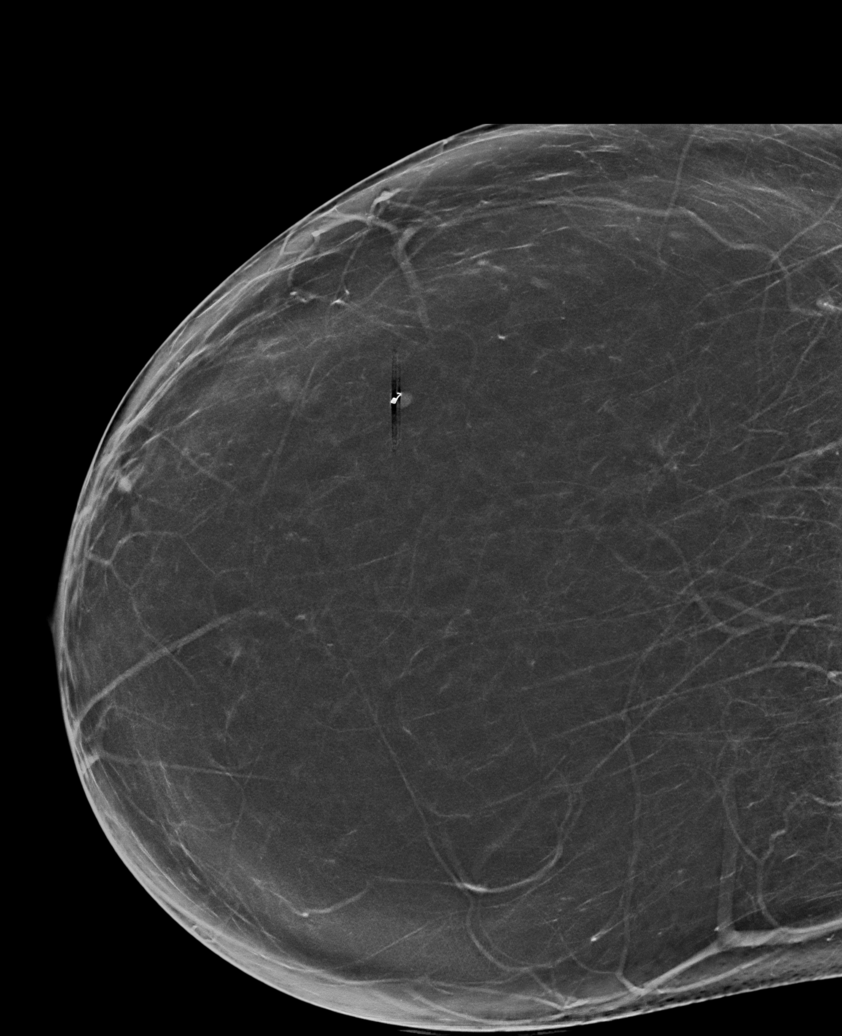

[L CC synth-2D]
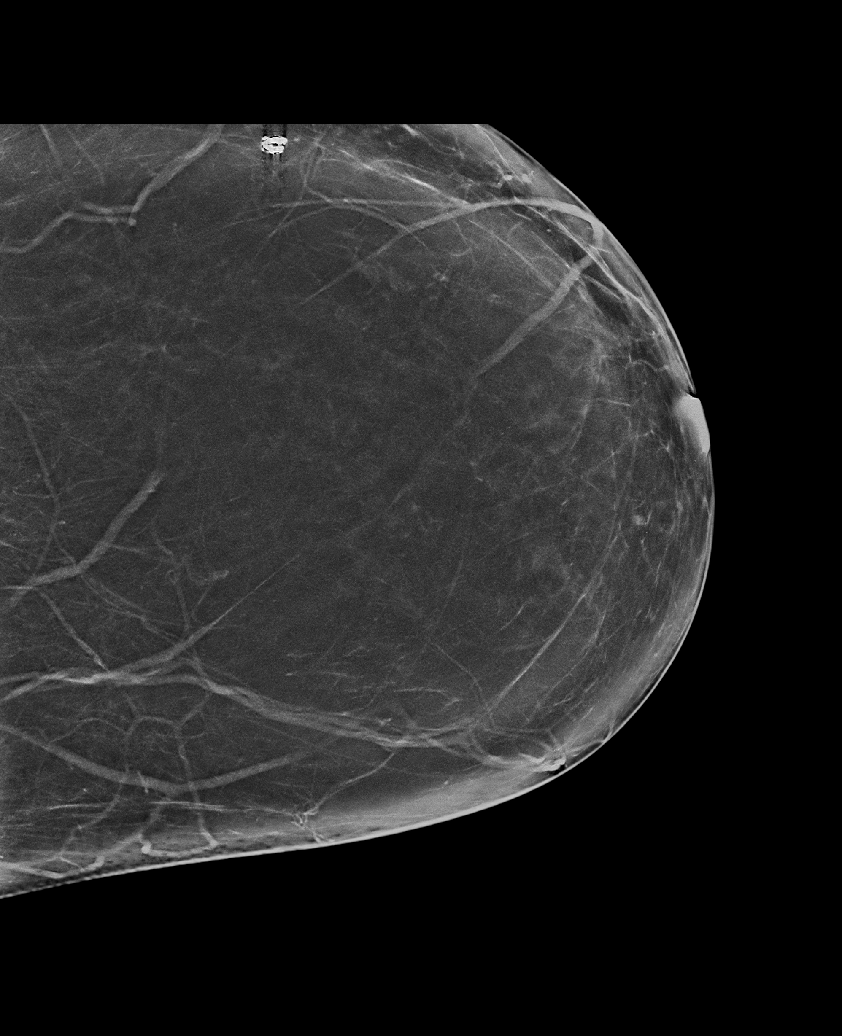

[R MLO synth-2D (2 of 2)]
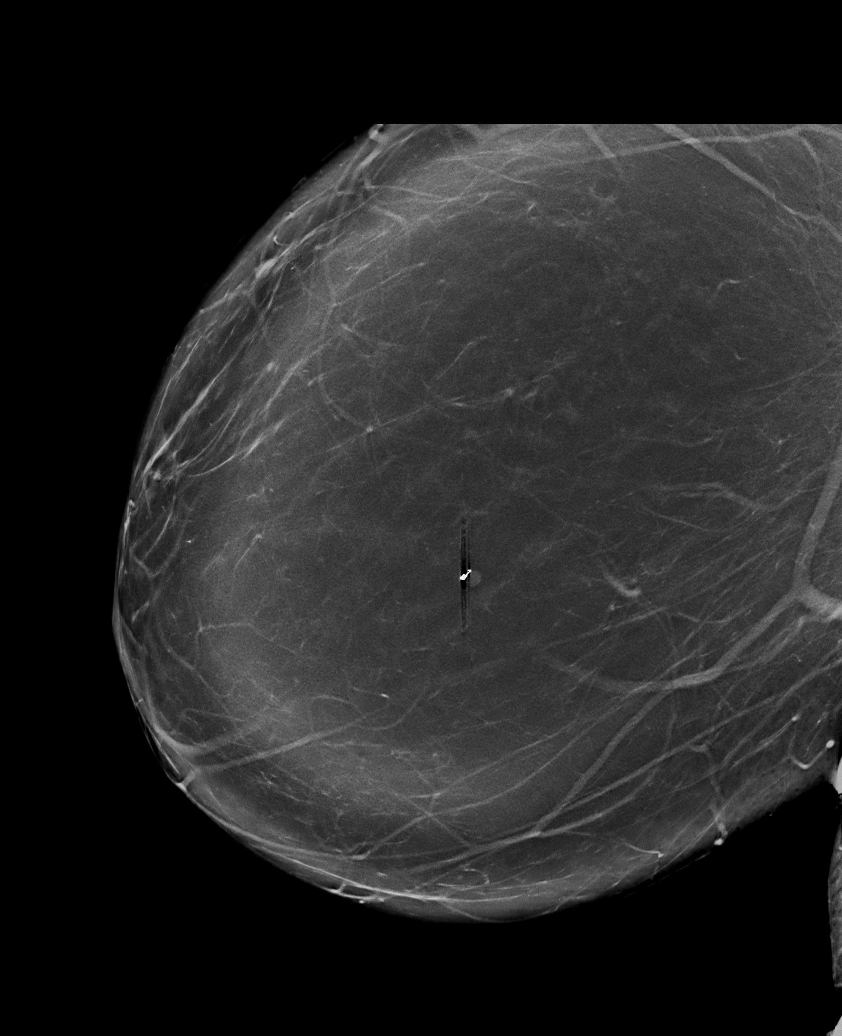

[R MLO tomo · tomo slice 51/101.0]
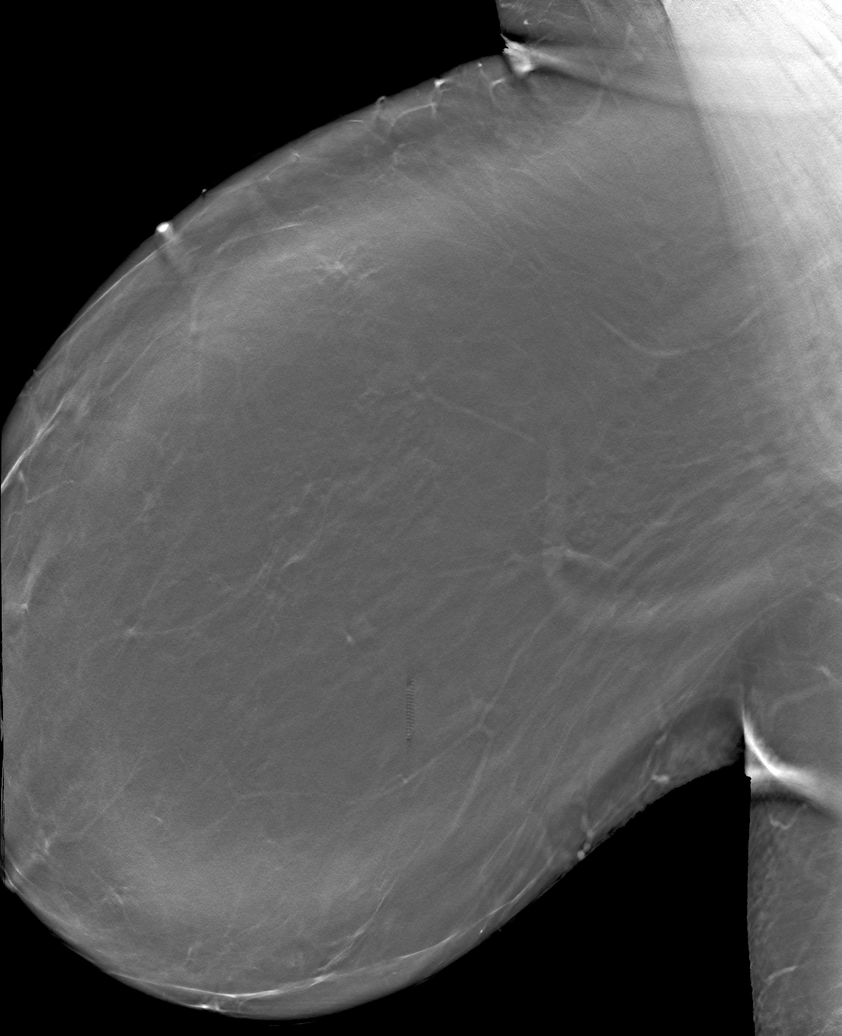

[6 of 30 positions shown; findings below may reference images not displayed]

FINDINGS: There are no findings suspicious for malignancy. Images were
processed with CAD.
IMPRESSION: No mammographic evidence of malignancy. A result letter of this
screening mammogram will be mailed directly to the patient.

RECOMMENDATION:
Screening mammogram in one year. (Code:[id number])

BI-RADS CATEGORY  1: Negative.

## 2021-08-29 ENCOUNTER — Other Ambulatory Visit (HOSPITAL_COMMUNITY): Payer: Self-pay | Admitting: Internal Medicine

## 2021-08-29 DIAGNOSIS — Z1231 Encounter for screening mammogram for malignant neoplasm of breast: Secondary | ICD-10-CM

## 2021-09-10 ENCOUNTER — Encounter (INDEPENDENT_AMBULATORY_CARE_PROVIDER_SITE_OTHER): Payer: Self-pay | Admitting: *Deleted

## 2021-09-17 ENCOUNTER — Ambulatory Visit (HOSPITAL_COMMUNITY)
Admission: RE | Admit: 2021-09-17 | Discharge: 2021-09-17 | Disposition: A | Discharge status: Home / Self Care | Payer: BC Managed Care – PPO | Source: Ambulatory Visit | Attending: Internal Medicine | Admitting: Internal Medicine

## 2021-09-17 DIAGNOSIS — Z1231 Encounter for screening mammogram for malignant neoplasm of breast: Secondary | ICD-10-CM | POA: Diagnosis present

## 2021-11-16 ENCOUNTER — Telehealth (INDEPENDENT_AMBULATORY_CARE_PROVIDER_SITE_OTHER): Payer: Self-pay | Admitting: *Deleted

## 2021-11-16 NOTE — Telephone Encounter (Signed)
Called pt to schedule TCS with Dr. Levon Hedger, room 1. She states she did not want to schedule until sometime in the summer.

## 2022-03-13 ENCOUNTER — Other Ambulatory Visit (INDEPENDENT_AMBULATORY_CARE_PROVIDER_SITE_OTHER): Payer: Self-pay

## 2022-03-13 DIAGNOSIS — Z1211 Encounter for screening for malignant neoplasm of colon: Secondary | ICD-10-CM

## 2022-03-25 ENCOUNTER — Telehealth (INDEPENDENT_AMBULATORY_CARE_PROVIDER_SITE_OTHER): Payer: Self-pay | Admitting: Gastroenterology

## 2022-03-25 NOTE — Telephone Encounter (Signed)
Pt LMOM that she couldn't get into her MyChart and had questions about being able to look over any paperwork regarding her procedure. Please advise. [phone] (She is scheduled colonoscopy for 04/26/2022)

## 2022-03-26 ENCOUNTER — Encounter (INDEPENDENT_AMBULATORY_CARE_PROVIDER_SITE_OTHER): Payer: Self-pay

## 2022-03-26 ENCOUNTER — Telehealth (INDEPENDENT_AMBULATORY_CARE_PROVIDER_SITE_OTHER): Payer: Self-pay

## 2022-03-26 ENCOUNTER — Other Ambulatory Visit (INDEPENDENT_AMBULATORY_CARE_PROVIDER_SITE_OTHER): Payer: Self-pay

## 2022-03-26 DIAGNOSIS — Z01812 Encounter for preprocedural laboratory examination: Secondary | ICD-10-CM

## 2022-03-26 DIAGNOSIS — Z1211 Encounter for screening for malignant neoplasm of colon: Secondary | ICD-10-CM

## 2022-03-26 MED ORDER — PEG 3350-KCL-NA BICARB-NACL 420 G PO SOLR: 4000.0000 mL | ORAL | 0 refills | Status: DC

## 2022-03-26 NOTE — Telephone Encounter (Signed)
Ok to schedule.  Thanks,  Mike Berntsen Castaneda Mayorga, MD Gastroenterology and Hepatology [facility]  

## 2022-03-26 NOTE — Telephone Encounter (Signed)
Tyrin Herbers Ann Charmayne Odell, CMA  ?

## 2022-03-26 NOTE — Telephone Encounter (Signed)
Referring MD/PCP: Margo Aye  Procedure: Tcs  Reason/Indication:  Screening  Has patient had this procedure before?  no  If so, when, by whom and where?    Is there a family history of colon cancer?  no  Who?  What age when diagnosed?    Is patient diabetic? If yes, Type 1 or Type 2   no      Does patient have prosthetic heart valve or mechanical valve?  no  Do you have a pacemaker/defibrillator?  no  Has patient ever had endocarditis/atrial fibrillation? no  Does patient use oxygen? no  Has patient had joint replacement within last 12 months?  no  Is patient constipated or do they take laxatives? no  Does patient have a history of alcohol/drug use?  no  Have you had a stroke/heart attack last 6 mths? no  Do you take medicine for weight loss?  no  For female patients,: have you had a hysterectomy no                      are you post menopausal no                      do you still have your menstrual cycle yes  Is patient on blood thinner such as Coumadin, Plavix and/or Aspirin? No   Medications: lisinopril 5 mg daily, trintellix 5 mg daily  Allergies: aspirin   Medication Adjustment per Dr Levon Hedger none  Procedure date & time: 04/26/22 at 730

## 2022-04-17 ENCOUNTER — Telehealth (INDEPENDENT_AMBULATORY_CARE_PROVIDER_SITE_OTHER): Payer: Self-pay

## 2022-04-17 MED ORDER — PEG 3350-KCL-NA BICARB-NACL 420 G PO SOLR: 4000.0000 mL | ORAL | 0 refills | Status: DC

## 2022-04-17 NOTE — Telephone Encounter (Signed)
Sheriff Rodenberg Ann Tishawn Friedhoff, CMA  ?

## 2022-04-23 ENCOUNTER — Encounter (HOSPITAL_COMMUNITY): Payer: Self-pay

## 2022-04-24 ENCOUNTER — Other Ambulatory Visit (HOSPITAL_COMMUNITY)
Admission: RE | Admit: 2022-04-24 | Discharge: 2022-04-24 | Disposition: A | Discharge status: Home / Self Care | Payer: BC Managed Care – PPO | Source: Ambulatory Visit | Attending: Gastroenterology | Admitting: Gastroenterology

## 2022-04-24 ENCOUNTER — Encounter (HOSPITAL_COMMUNITY)
Admission: RE | Admit: 2022-04-24 | Discharge: 2022-04-24 | Disposition: A | Discharge status: Home / Self Care | Payer: BC Managed Care – PPO | Source: Ambulatory Visit | Attending: Gastroenterology | Admitting: Gastroenterology

## 2022-04-24 VITALS — Ht 69.0 in | Wt 280.0 lb

## 2022-04-24 DIAGNOSIS — F419 Anxiety disorder, unspecified: Secondary | ICD-10-CM | POA: Diagnosis not present

## 2022-04-24 DIAGNOSIS — K5730 Diverticulosis of large intestine without perforation or abscess without bleeding: Secondary | ICD-10-CM | POA: Diagnosis not present

## 2022-04-24 DIAGNOSIS — K648 Other hemorrhoids: Secondary | ICD-10-CM | POA: Diagnosis not present

## 2022-04-24 DIAGNOSIS — F32A Depression, unspecified: Secondary | ICD-10-CM | POA: Diagnosis not present

## 2022-04-24 DIAGNOSIS — Z01812 Encounter for preprocedural laboratory examination: Secondary | ICD-10-CM | POA: Insufficient documentation

## 2022-04-24 DIAGNOSIS — Z87891 Personal history of nicotine dependence: Secondary | ICD-10-CM | POA: Diagnosis not present

## 2022-04-24 DIAGNOSIS — Z01818 Encounter for other preprocedural examination: Secondary | ICD-10-CM

## 2022-04-24 DIAGNOSIS — E785 Hyperlipidemia, unspecified: Secondary | ICD-10-CM | POA: Diagnosis not present

## 2022-04-24 DIAGNOSIS — Z1211 Encounter for screening for malignant neoplasm of colon: Secondary | ICD-10-CM | POA: Diagnosis present

## 2022-04-24 LAB — PREGNANCY, URINE: Preg Test, Ur: NEGATIVE

## 2022-04-26 ENCOUNTER — Encounter (HOSPITAL_COMMUNITY): Payer: Self-pay | Admitting: Gastroenterology

## 2022-04-26 ENCOUNTER — Encounter (HOSPITAL_COMMUNITY)
Admission: RE | Disposition: A | Discharge status: Home / Self Care | Payer: Self-pay | Source: Home / Self Care | Attending: Gastroenterology

## 2022-04-26 ENCOUNTER — Ambulatory Visit (HOSPITAL_COMMUNITY)
Admission: RE | Admit: 2022-04-26 | Discharge: 2022-04-26 | Disposition: A | Discharge status: Home / Self Care | Payer: BC Managed Care – PPO | Attending: Gastroenterology | Admitting: Gastroenterology

## 2022-04-26 ENCOUNTER — Ambulatory Visit (HOSPITAL_COMMUNITY): Payer: BC Managed Care – PPO | Admitting: Anesthesiology

## 2022-04-26 DIAGNOSIS — Z01818 Encounter for other preprocedural examination: Secondary | ICD-10-CM

## 2022-04-26 DIAGNOSIS — F32A Depression, unspecified: Secondary | ICD-10-CM | POA: Insufficient documentation

## 2022-04-26 DIAGNOSIS — F419 Anxiety disorder, unspecified: Secondary | ICD-10-CM | POA: Insufficient documentation

## 2022-04-26 DIAGNOSIS — Z1211 Encounter for screening for malignant neoplasm of colon: Secondary | ICD-10-CM

## 2022-04-26 DIAGNOSIS — Z87891 Personal history of nicotine dependence: Secondary | ICD-10-CM | POA: Insufficient documentation

## 2022-04-26 DIAGNOSIS — K5730 Diverticulosis of large intestine without perforation or abscess without bleeding: Secondary | ICD-10-CM | POA: Diagnosis not present

## 2022-04-26 DIAGNOSIS — K648 Other hemorrhoids: Secondary | ICD-10-CM | POA: Diagnosis not present

## 2022-04-26 DIAGNOSIS — E785 Hyperlipidemia, unspecified: Secondary | ICD-10-CM | POA: Insufficient documentation

## 2022-04-26 HISTORY — PX: COLONOSCOPY WITH PROPOFOL: SHX5780

## 2022-04-26 LAB — HM COLONOSCOPY

## 2022-04-26 SURGERY — COLONOSCOPY WITH PROPOFOL
Anesthesia: General

## 2022-04-26 MED ORDER — LACTATED RINGERS IV SOLN: INTRAVENOUS | Status: DC

## 2022-04-26 MED ORDER — LIDOCAINE HCL (CARDIAC) PF 100 MG/5ML IV SOSY
PREFILLED_SYRINGE | INTRAVENOUS | Status: DC | PRN
  Administered 2022-04-26: 50 mg via INTRAVENOUS

## 2022-04-26 MED ORDER — PROPOFOL 500 MG/50ML IV EMUL
INTRAVENOUS | Status: DC | PRN
  Administered 2022-04-26: 100 ug/kg/min via INTRAVENOUS

## 2022-04-26 MED ORDER — PROPOFOL 10 MG/ML IV BOLUS
INTRAVENOUS | Status: DC | PRN
  Administered 2022-04-26 (×2): 100 mg via INTRAVENOUS

## 2022-04-26 NOTE — Op Note (Signed)
[facility] Patient Name: [name] Procedure Date: 04/26/2022 7:15 AM MRN: [id number] Date of Birth: Jan 20, 1971 Attending MD: Katrinka Blazing ,  CSN: [id number] Age: 51 Admit Type: Outpatient Procedure:                Colonoscopy Indications:              Screening for colorectal malignant neoplasm Providers:                Katrinka Blazing, Jannett Celestine, RN, Hinton Rao, Dyann Ruddle Referring MD:              Medicines:                Monitored Anesthesia Care Complications:            No immediate complications. Estimated Blood Loss:     Estimated blood loss: none. Procedure:                Pre-Anesthesia Assessment:                           - Prior to the procedure, a History and Physical                            was performed, and patient medications, allergies                            and sensitivities were reviewed. The patient's                            tolerance of previous anesthesia was reviewed.                           - The risks and benefits of the procedure and the                            sedation options and risks were discussed with the                            patient. All questions were answered and informed                            consent was obtained.                           - ASA Grade Assessment: II - A patient with mild                            systemic disease.                           After obtaining informed consent, the colonoscope                            was passed under direct vision. Throughout the  procedure, the patient's blood pressure, pulse, and                            oxygen saturations were monitored continuously. The                            [id number]-HQ190L ([id number]) scope was introduced through                            the anus and advanced to the the cecum, identified                            by appendiceal orifice and ileocecal valve. The                             colonoscopy was performed without difficulty. The                            patient tolerated the procedure well. The quality                            of the bowel preparation was good. Scope In: 7:34:37 AM Scope Out: 7:50:41 AM Scope Withdrawal Time: 0 hours 11 minutes 6 seconds  Total Procedure Duration: 0 hours 16 minutes 4 seconds  Findings:      The perianal and digital rectal examinations were normal.      A few medium-mouthed diverticula were found in the transverse colon.      Non-bleeding internal hemorrhoids were found during retroflexion. The       hemorrhoids were small. Impression:               - Diverticulosis in the transverse colon.                           - Non-bleeding internal hemorrhoids.                           - No specimens collected. Moderate Sedation:      Per Anesthesia Care Recommendation:           - Discharge patient to home (ambulatory).                           - Resume previous diet.                           - Repeat colonoscopy in 10 years for screening                            purposes. Procedure Code(s):        --- Professional ---                           E9937, Colorectal cancer screening; colonoscopy on                            individual not meeting criteria for high  risk Diagnosis Code(s):        --- Professional ---                           Z12.11, Encounter for screening for malignant                            neoplasm of colon                           K64.8, Other hemorrhoids                           K57.30, Diverticulosis of large intestine without                            perforation or abscess without bleeding CPT copyright 2019 [employer]. All rights reserved. The codes documented in this report are preliminary and upon coder review may  be revised to meet current compliance requirements. Katrinka Blazing, MD Katrinka Blazing,  04/26/2022 7:53:57 AM This report has been signed  electronically. Number of Addenda: 0

## 2022-04-26 NOTE — Anesthesia Postprocedure Evaluation (Signed)
Anesthesia Post Note  Patient: [name]  Procedure(s) Performed: COLONOSCOPY WITH PROPOFOL  Patient location during evaluation: Phase II Anesthesia Type: General Level of consciousness: awake and alert and oriented Pain management: pain level controlled Vital Signs Assessment: post-procedure vital signs reviewed and stable Respiratory status: spontaneous breathing, nonlabored ventilation and respiratory function stable Cardiovascular status: blood pressure returned to baseline and stable Postop Assessment: no apparent nausea or vomiting Anesthetic complications: no   No notable events documented.   Last Vitals:  Vitals:   04/26/22 0644 04/26/22 0755  BP: (!) 143/63 (!) 97/38  Pulse: 68 71  Resp: 11 12  Temp: 36.7 C 36.7 C  SpO2: 96% 98%    Last Pain:  Vitals:   04/26/22 0755  TempSrc: Oral  PainSc: Asleep                 Arieal Cuoco C Gadiel John

## 2022-04-26 NOTE — H&P (Signed)
[name] is an 51 y.o. female.   Chief Complaint: screening colonoscopy HPI: 51 year old female with past medical history of depression, hyperlipidemia and anxiety, coming for screening colonoscopy. The patient has never had a colonoscopy in the past.  The patient denies having any complaints such as melena, hematochezia, abdominal pain or distention, change in her bowel movement consistency or frequency, no changes in her weight recently.  No family history of colorectal cancer.   Past Medical History:  Diagnosis Date   Anxiety    Depression    HLD (hyperlipidemia) 02/24/2017   Primary osteoarthritis of both knees 01/23/2017    Past Surgical History:  Procedure Laterality Date   POLYPECTOMY     TUBAL LIGATION      Family History  Problem Relation Age of Onset   Diabetes Mother    Hypertension Mother    Stroke Father    Dementia Father    Early death Brother        MVA   Social History:  reports that she has quit smoking. She has never used smokeless tobacco. She reports that she does not drink alcohol and does not use drugs.  Allergies:  Allergies  Allergen Reactions   Aspirin Other (See Comments)    Bloody stools    Codeine Nausea And Vomiting    Medications Prior to Admission  Medication Sig Dispense Refill   acetaminophen (TYLENOL) 500 MG tablet Take 1,000 mg by mouth every 6 (six) hours as needed for moderate pain.     lisinopril (ZESTRIL) 5 MG tablet Take 5 mg by mouth daily.     polyethylene glycol-electrolytes (TRILYTE) 420 g solution Take 4,000 mLs by mouth as directed. 4000 mL 0   TRINTELLIX 5 MG TABS tablet Take 5 mg by mouth daily.      Results for orders placed or performed during the hospital encounter of 04/24/22 (from the past 48 hour(s))  Pregnancy, urine     Status: None   Collection Time: 04/24/22 11:52 AM  Result Value Ref Range   Preg Test, Ur NEGATIVE NEGATIVE    Comment:        THE SENSITIVITY OF THIS METHODOLOGY IS >20  mIU/mL. Performed at [facility], [address] [place], [place] [address]    No results found.  Review of Systems  All other systems reviewed and are negative.   Blood pressure (!) 143/63, pulse 68, temperature 98.1 F (36.7 C), temperature source Oral, resp. rate 11, height 5\' 9"  (1.753 m), weight 135 kg, SpO2 96 %. Physical Exam  GENERAL: The patient is AO x3, in no acute distress. HEENT: Head is normocephalic and atraumatic. EOMI are intact. Mouth is well hydrated and without lesions. NECK: Supple. No masses LUNGS: Clear to auscultation. No presence of rhonchi/wheezing/rales. Adequate chest expansion HEART: RRR, normal s1 and s2. ABDOMEN: Soft, nontender, no guarding, no peritoneal signs, and nondistended. BS +. No masses. EXTREMITIES: Without any cyanosis, clubbing, rash, lesions or edema. NEUROLOGIC: AOx3, no focal motor deficit. SKIN: no jaundice, no rashes  Assessment/Plan 51 year old female with past medical history of depression, hyperlipidemia and anxiety, coming for screening colonoscopy. The patient is at average risk for colorectal cancer.  We will proceed with colonoscopy today.   42, MD 04/26/2022, 7:26 AM

## 2022-04-26 NOTE — Discharge Instructions (Signed)
You are being discharged to home.  Resume your previous diet.  Your physician has recommended a repeat colonoscopy in 10 years for screening purposes.  

## 2022-04-26 NOTE — Anesthesia Preprocedure Evaluation (Signed)
Anesthesia Evaluation  Patient identified by MRN, date of birth, ID band Patient awake    Reviewed: Allergy & Precautions, NPO status , Patient's Chart, lab work & pertinent test results  Airway        Dental  (+) Dental Advisory Given   Pulmonary former smoker,    Pulmonary exam normal breath sounds clear to auscultation       Cardiovascular Exercise Tolerance: Good negative cardio ROS Normal cardiovascular exam Rhythm:Regular Rate:Normal     Neuro/Psych PSYCHIATRIC DISORDERS Anxiety Depression negative neurological ROS     GI/Hepatic negative GI ROS, Neg liver ROS,   Endo/Other  Morbid obesity  Renal/GU negative Renal ROS  negative genitourinary   Musculoskeletal  (+) Arthritis , Osteoarthritis,    Abdominal   Peds negative pediatric ROS (+)  Hematology negative hematology ROS (+)   Anesthesia Other Findings   Reproductive/Obstetrics negative OB ROS                            Anesthesia Physical Anesthesia Plan  ASA: 3  Anesthesia Plan: General   Post-op Pain Management: Minimal or no pain anticipated   Induction: Intravenous  PONV Risk Score and Plan: Propofol infusion  Airway Management Planned: Nasal Cannula and Natural Airway  Additional Equipment:   Intra-op Plan:   Post-operative Plan:   Informed Consent: I have reviewed the patients History and Physical, chart, labs and discussed the procedure including the risks, benefits and alternatives for the proposed anesthesia with the patient or authorized representative who has indicated his/her understanding and acceptance.     Dental advisory given  Plan Discussed with: CRNA and Surgeon  Anesthesia Plan Comments:        Anesthesia Quick Evaluation

## 2022-04-26 NOTE — Transfer of Care (Signed)
Immediate Anesthesia Transfer of Care Note  Patient: [name] Route  Procedure(s) Performed: COLONOSCOPY WITH PROPOFOL  Patient Location: [place]  Anesthesia Type:General  Level of Consciousness: awake and drowsy  Airway & Oxygen Therapy: Patient Spontanous Breathing  Post-op Assessment: Report given to RN and Post -op Vital signs reviewed and stable  Post vital signs: Reviewed and stable  Last Vitals:  Vitals Value Taken Time  BP 90/46 0757  Temp    Pulse 80 0757  Resp 18 0757  SpO2 95 0757    Last Pain:  Vitals:   04/26/22 0730  TempSrc:   PainSc: 0-No pain         Complications: No notable events documented.

## 2022-04-29 ENCOUNTER — Encounter (INDEPENDENT_AMBULATORY_CARE_PROVIDER_SITE_OTHER): Payer: Self-pay | Admitting: *Deleted

## 2022-05-02 ENCOUNTER — Encounter (HOSPITAL_COMMUNITY): Payer: Self-pay | Admitting: Gastroenterology

## 2022-06-17 ENCOUNTER — Telehealth (INDEPENDENT_AMBULATORY_CARE_PROVIDER_SITE_OTHER): Payer: BC Managed Care – PPO | Admitting: Clinical

## 2022-06-17 ENCOUNTER — Encounter (HOSPITAL_COMMUNITY): Payer: Self-pay

## 2022-06-17 DIAGNOSIS — F419 Anxiety disorder, unspecified: Secondary | ICD-10-CM

## 2022-06-17 DIAGNOSIS — F331 Major depressive disorder, recurrent, moderate: Secondary | ICD-10-CM

## 2022-06-17 NOTE — Plan of Care (Signed)
Verbal Consent 
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
## 2022-07-25 ENCOUNTER — Ambulatory Visit (HOSPITAL_COMMUNITY): Payer: BC Managed Care – PPO | Admitting: Clinical

## 2022-08-06 ENCOUNTER — Ambulatory Visit (HOSPITAL_COMMUNITY): Payer: BC Managed Care – PPO | Admitting: Clinical

## 2022-10-02 ENCOUNTER — Other Ambulatory Visit (HOSPITAL_COMMUNITY): Payer: Self-pay | Admitting: Internal Medicine

## 2022-10-02 DIAGNOSIS — Z1231 Encounter for screening mammogram for malignant neoplasm of breast: Secondary | ICD-10-CM

## 2022-10-03 ENCOUNTER — Encounter (HOSPITAL_COMMUNITY): Payer: Self-pay | Admitting: Radiology

## 2022-10-03 ENCOUNTER — Ambulatory Visit (HOSPITAL_COMMUNITY)
Admission: RE | Admit: 2022-10-03 | Discharge: 2022-10-03 | Disposition: A | Discharge status: Home / Self Care | Payer: BC Managed Care – PPO | Source: Ambulatory Visit | Attending: Internal Medicine | Admitting: Internal Medicine

## 2022-10-03 DIAGNOSIS — Z1231 Encounter for screening mammogram for malignant neoplasm of breast: Secondary | ICD-10-CM | POA: Diagnosis present

## 2023-06-13 ENCOUNTER — Ambulatory Visit
Admission: RE | Admit: 2023-06-13 | Discharge: 2023-06-13 | Disposition: A | Discharge status: Home / Self Care | Payer: BC Managed Care – PPO | Source: Ambulatory Visit | Attending: Internal Medicine | Admitting: Internal Medicine

## 2023-06-13 VITALS — BP 143/77 | HR 76 | Temp 98.3°F | Resp 18

## 2023-06-13 DIAGNOSIS — R8290 Unspecified abnormal findings in urine: Secondary | ICD-10-CM | POA: Diagnosis not present

## 2023-06-13 DIAGNOSIS — M545 Low back pain, unspecified: Secondary | ICD-10-CM

## 2023-06-13 DIAGNOSIS — R350 Frequency of micturition: Secondary | ICD-10-CM

## 2023-06-13 DIAGNOSIS — M5136 Other intervertebral disc degeneration, lumbar region: Secondary | ICD-10-CM | POA: Diagnosis not present

## 2023-06-13 LAB — POCT URINALYSIS DIP (MANUAL ENTRY)
Bilirubin, UA: NEGATIVE
Glucose, UA: NEGATIVE mg/dL
Nitrite, UA: NEGATIVE
Protein Ur, POC: NEGATIVE mg/dL
Spec Grav, UA: >=1.03 — AB (ref 1.010–1.025)
Urobilinogen, UA: 0.2 U/dL
pH, UA: 5.5 (ref 5.0–8.0)

## 2023-06-13 MED ORDER — BACLOFEN 10 MG PO TABS: 10.0000 mg | ORAL_TABLET | Freq: Two times a day (BID) | ORAL | 0 refills | Status: AC | PRN

## 2023-06-13 MED ORDER — LIDOCAINE 5 % EX PTCH
1.0000 | MEDICATED_PATCH | CUTANEOUS | 0 refills | Status: AC
Start: 2023-06-13 — End: ?

## 2023-06-13 MED ORDER — METHYLPREDNISOLONE ACETATE 80 MG/ML IJ SUSP
60.0000 mg | Freq: Once | INTRAMUSCULAR | Status: AC
  Administered 2023-06-13: 60 mg via INTRAMUSCULAR

## 2023-06-13 NOTE — ED Provider Notes (Signed)
[facility]    CSN: [id number] Arrival date & time: 06/13/23  1541      History   Chief Complaint Chief Complaint  Patient presents with   Back Pain    Need a steroid shot - Entered by patient    HPI [name] is a 52 y.o. female.   Patient presents today with a 1 week history of bilateral lower back pain.  She reports pain is rated 9 on 0 10 pain scale, described as aching/throbbing/sharp, worse with certain movements or activities, no alleviating factors identified.  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  She had an MRI with [facility] in 2019 but has not had any more recent imaging.  She reports occasionally getting flares that require steroids to help manage symptoms.  She has difficulty tolerating NSAIDs due to GI upset and bleeding.  She has been taking Tylenol without improvement of symptoms.  She denies any known injury increase activity prior to symptom onset.  Denies any recent fall or trauma.  She denies any significant urinary symptoms but is concerned that she might have a urinary tract infection.  Denies any fever, nausea, vomiting.  Denies previous spinal surgery.  Denies any bowel/bladder incontinence, lower extremity weakness, saddle anesthesia.    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      Home Medications    Prior to Admission medications   Medication Sig Start Date End Date Taking? Authorizing Provider  baclofen (LIORESAL) 10 MG tablet Take 1 tablet (10 mg total) by mouth 2 (two) times daily as needed for muscle spasms. 06/13/23  Yes Nuno Brubacher, Noberto Retort, PA-C  Dextromethorphan-buPROPion ER (AUVELITY) 45-105 MG TBCR Take 45 each by mouth. 45mg    Yes [provider]  lidocaine (LIDODERM) 5 % Place 1 patch onto the skin daily. Remove & Discard patch within 12 hours or as directed by MD 06/13/23  Yes Horrace Hanak, Noberto Retort, PA-C  traZODone (DESYREL) 50 MG tablet Take 50 mg by mouth at bedtime.   Yes [provider]  acetaminophen (TYLENOL) 500 MG tablet Take 1,000 mg by mouth every 6 (six) hours as needed for moderate pain.    [provider]  lisinopril (ZESTRIL) 5 MG tablet Take 5 mg by mouth daily. 02/08/22   [provider]    Family History Family History  Problem Relation Age of Onset   Diabetes Mother    Hypertension Mother    Stroke Father    Dementia Father    Early death Brother        MVA    Social History  Social History   Tobacco Use   Smoking status: Former   Smokeless tobacco: Never  Substance Use Topics   Alcohol use: No   Drug use: No     Allergies   Aspirin and Codeine   Review of Systems Review of Systems  Constitutional:  Positive for activity change. Negative for appetite change, fatigue and fever.  Gastrointestinal:  Negative for abdominal pain, diarrhea, nausea and vomiting.  Genitourinary:  Positive for frequency. Negative for dysuria, hematuria, pelvic pain and urgency.  Musculoskeletal:  Positive for back pain. Negative for arthralgias and myalgias.  Neurological:  Negative for weakness and numbness.     Physical Exam Triage Vital  Signs ED Triage Vitals  Encounter Vitals Group     BP 06/13/23 1618 (!) 143/77     Systolic BP Percentile --      Diastolic BP Percentile --      Pulse Rate 06/13/23 1618 76     Resp 06/13/23 1618 18     Temp 06/13/23 1618 98.3 F (36.8 C)     Temp Source 06/13/23 1618 Oral     SpO2 06/13/23 1618 96 %     Weight --      Height --      Head Circumference --      Peak Flow --      Pain Score 06/13/23 1620 9     Pain Loc --      Pain Education --      Exclude from Growth Chart --    No data found.  Updated Vital Signs BP (!) 143/77 (BP Location: Right Arm)   Pulse 76   Temp 98.3 F (36.8 C) (Oral)   Resp 18   LMP 06/27/2020   SpO2 96%   Visual Acuity Right Eye Distance:   Left Eye Distance:   Bilateral Distance:    Right Eye Near:   Left Eye Near:    Bilateral Near:     Physical Exam Vitals reviewed.  Constitutional:      General: She is awake. She is not in acute distress.    Appearance: Normal appearance. She is well-developed. She is not ill-appearing.     Comments: Very pleasant female appears stated age in no acute distress sitting comfortably in exam room  HENT:     Head: Normocephalic and atraumatic.  Cardiovascular:     Rate and Rhythm: Normal rate and regular rhythm.     Heart sounds: Normal heart sounds, S1 normal and S2 normal. No murmur heard. Pulmonary:     Effort: Pulmonary effort is normal.     Breath sounds: Normal breath sounds. No wheezing, rhonchi or rales.     Comments: Clear to auscultation bilaterally Abdominal:     General: Bowel sounds are normal.     Palpations: Abdomen is soft.     Tenderness: There is no abdominal tenderness. There is no right CVA tenderness, left CVA tenderness, guarding or rebound.     Comments: Benign abdominal exam  Musculoskeletal:     Cervical back: No tenderness or bony tenderness.     Thoracic back: No tenderness or bony tenderness.     Lumbar back: Tenderness present. No bony tenderness. Negative right  straight leg raise test and negative left straight leg raise test.     Comments: Mild tender to palpation of bilateral lumbar paraspinal muscles.  No focal bony tenderness on exam or pain percussion of vertebrae.  No deformity or step-off noted.  Strength 5/5 bilateral lower  extremities.  Negative straight leg raise bilaterally.  Psychiatric:        Behavior: Behavior is cooperative.      UC Treatments / Results  Labs (all labs ordered are listed, but only abnormal results are displayed) Labs Reviewed  POCT URINALYSIS DIP (MANUAL ENTRY) - Abnormal; Notable for the following components:      Result Value   Ketones, POC UA trace (5) (*)    Spec Grav, UA >=1.030 (*)    Blood, UA trace-lysed (*)    Leukocytes, UA Trace (*)    All other components within normal limits  URINE CULTURE    EKG   Radiology No results found.  Procedures Procedures (including critical care time)  Medications Ordered in UC Medications  methylPREDNISolone acetate (DEPO-MEDROL) injection 60 mg (60 mg Intramuscular Given 06/13/23 1644)    Initial Impression / Assessment and Plan / UC Course  I have reviewed the triage vital signs and the nursing notes.  Pertinent labs & imaging results that were available during my care of the patient were reviewed by me and considered in my medical decision making (see chart for details).     Patient is well-appearing, afebrile, nontoxic, nontachycardic.  Urine was obtained and she reports some urinary frequency which showed trace leukocytes but otherwise was normal.  Will send this for culture but defer antibiotics until results are available.  I suspect symptoms are more muscular in nature.  She has no focal bony tenderness and denies any recent trauma so plain films were deferred.  She has difficulty tolerating NSAIDs and was given injection of Depo-Medrol in clinic.  Baclofen was sent to pharmacy and we discussed that this can be sedating and she is not to drive or  drink alcohol while taking it.  She can use lidocaine patches for additional symptom relief and recommended that she apply these for 12 hours and then remove them for 12 hours using only 1 patch per 24 hours.  She has established with a orthopedic provider and was encouraged to follow-up with them as she may benefit from repeat imaging or physical therapy referral if her symptoms persist which are not things that we can arrange in urgent care.  Discussed that if she has any worsening or changing symptoms including bowel/bladder incontinence, lower extremity weakness, saddle anesthesia she needs to be seen immediately.  Strict return precautions given.  Work excuse note provided.  Final Clinical Impressions(s) / UC Diagnoses   Final diagnoses:  Urinary frequency  Acute bilateral low back pain without sciatica  Abnormal urinalysis     Discharge Instructions      Your urine was not convincing of infection.  We will send this for culture but wait to start antibiotics until we get these results.  Make sure you are drinking plenty of fluid.  I think your symptoms are more likely related to a muscle strain in your back.  We gave an injection of steroids today.  Please use over-the-counter medication such as Tylenol and ibuprofen if tolerated for symptom relief.  I have called in baclofen to help with your pain but this will make you sleepy so do not drive or drink alcohol while taking it.  I also called in lidocaine patches that you can use on specific areas that are bothersome.  Apply this for 12 hours and then remove it for 12 hours; use only 1 patch per 24 hours.  Follow-up with your orthopedic provider for further evaluation and management.  If anything worsens  and you have increasing pain, weakness in your legs, going to the bathroom on yourself without noticing it, numbness or tingling sensation in your legs you need to be seen immediately.     ED Prescriptions     Medication Sig Dispense Auth.  Provider   baclofen (LIORESAL) 10 MG tablet Take 1 tablet (10 mg total) by mouth 2 (two) times daily as needed for muscle spasms. 30 each Zerenity Bowron K, PA-C   lidocaine (LIDODERM) 5 % Place 1 patch onto the skin daily. Remove & Discard patch within 12 hours or as directed by MD 30 patch Paden Senger K, PA-C      PDMP not reviewed this encounter.   Jeani Hawking, PA-C 06/13/23 1645

## 2023-06-13 NOTE — Discharge Instructions (Signed)
Your urine was not convincing of infection.  We will send this for culture but wait to start antibiotics until we get these results.  Make sure you are drinking plenty of fluid.  I think your symptoms are more likely related to a muscle strain in your back.  We gave an injection of steroids today.  Please use over-the-counter medication such as Tylenol and ibuprofen if tolerated for symptom relief.  I have called in baclofen to help with your pain but this will make you sleepy so do not drive or drink alcohol while taking it.  I also called in lidocaine patches that you can use on specific areas that are bothersome.  Apply this for 12 hours and then remove it for 12 hours; use only 1 patch per 24 hours.  Follow-up with your orthopedic provider for further evaluation and management.  If anything worsens and you have increasing pain, weakness in your legs, going to the bathroom on yourself without noticing it, numbness or tingling sensation in your legs you need to be seen immediately.

## 2023-06-13 NOTE — ED Triage Notes (Signed)
Lower back pain x 1 week.  Denies any urinary symptoms.

## 2023-06-15 LAB — URINE CULTURE

## 2023-10-06 ENCOUNTER — Other Ambulatory Visit (HOSPITAL_COMMUNITY): Payer: Self-pay | Admitting: Internal Medicine

## 2023-10-06 DIAGNOSIS — Z1231 Encounter for screening mammogram for malignant neoplasm of breast: Secondary | ICD-10-CM

## 2023-10-22 ENCOUNTER — Ambulatory Visit (HOSPITAL_COMMUNITY): Payer: BC Managed Care – PPO

## 2023-10-22 ENCOUNTER — Ambulatory Visit (HOSPITAL_COMMUNITY)
Admission: RE | Admit: 2023-10-22 | Discharge: 2023-10-22 | Disposition: A | Discharge status: Home / Self Care | Payer: 59 | Source: Ambulatory Visit | Attending: Internal Medicine | Admitting: Internal Medicine

## 2023-10-22 DIAGNOSIS — Z1231 Encounter for screening mammogram for malignant neoplasm of breast: Secondary | ICD-10-CM | POA: Diagnosis present

## 2023-11-19 ENCOUNTER — Other Ambulatory Visit (HOSPITAL_COMMUNITY): Payer: Self-pay | Admitting: Nurse Practitioner

## 2023-11-19 DIAGNOSIS — R109 Unspecified abdominal pain: Secondary | ICD-10-CM

## 2024-04-02 NOTE — Progress Notes (Signed)
 WHISPER TEST DONE FOR HEARING

## 2024-07-05 ENCOUNTER — Encounter (INDEPENDENT_AMBULATORY_CARE_PROVIDER_SITE_OTHER): Payer: Self-pay | Admitting: *Deleted

## 2024-07-06 ENCOUNTER — Telehealth (INDEPENDENT_AMBULATORY_CARE_PROVIDER_SITE_OTHER): Payer: Self-pay

## 2024-07-06 ENCOUNTER — Ambulatory Visit (INDEPENDENT_AMBULATORY_CARE_PROVIDER_SITE_OTHER): Admitting: Gastroenterology

## 2024-07-06 VITALS — BP 98/62 | Temp 97.7°F | Ht 69.0 in | Wt 303.9 lb

## 2024-07-06 DIAGNOSIS — R110 Nausea: Secondary | ICD-10-CM | POA: Insufficient documentation

## 2024-07-06 DIAGNOSIS — R1010 Upper abdominal pain, unspecified: Secondary | ICD-10-CM | POA: Diagnosis not present

## 2024-07-06 MED ORDER — SUCRALFATE 1 GM/10ML PO SUSP: 1.0000 g | Freq: Four times a day (QID) | ORAL | 1 refills | Status: DC

## 2024-07-06 NOTE — Patient Instructions (Signed)
 Please continue protonix 40mg  twice daily as you are doing I have sent carafate 1g to take before meals and at bedtime to help with pain We will get you scheduled for upper endoscopy for further evaluation  Follow up 3 months  It was a pleasure to see you today. I want to create trusting relationships with patients and provide genuine, compassionate, and quality care. I truly value your feedback! please be on the lookout for a survey regarding your visit with me today. I appreciate your input about our visit and your time in completing this!    Mellissa Conley L. Kenyette Gundy, MSN, APRN, AGNP-C Adult-Gerontology Nurse Practitioner [facility] at [facility]

## 2024-07-06 NOTE — Telephone Encounter (Signed)
 Spoke with patient, scheduled EGD for 07/16/2024 at 11:30am. Instructions sent though the mail. Patient aware and verbalized understanding.

## 2024-07-06 NOTE — H&P (View-Only) (Signed)
 Referring Provider: Shona Norleen PEDLAR, MD Primary Care Physician:  Angela Norleen PEDLAR, MD Primary GI Physician: Dr. Eartha   Chief Complaint  Patient presents with   Abdominal Pain    Having sickness. Pt states she has ulcers   HPI:   [name] is a 53 y.o. female with past medical history of depression, hyperlipidemia and anxiety   Patient presenting today for:  Abdominal pain and nausea   Has been having upper abdominal pain, seeing her PCP for this who started PPI. She notes pain is in mid upper abdomen, comes and goes. No radiation of pain. PPI helps her pain some but she still has it. Sometimes eating makes the pain worse but not everytime. She has been avoiding fried, spicy foods. She does note that having an empty stomach sometimes precipitates the pain. She describes sensation as similar to when you have stomach virus. Has some nausea but no vomiting. No constipation or diarrhea. Denies rectal bleeding or melena. No changes in appetite or weight loss. Denies any new medications prior to onset. No previous history of known PUD. No dysphagia or odynophagia. Is not taking any NSAIDs. Does not drink or smoke.    Last Colonoscopy:04/2022 Diverticulosis in the transverse colon.                           - Non-bleeding internal hemorrhoids.                           - No specimens collected. Last Endoscopy: never   Recommendations:  Repeat TCS 10 years    Past Medical History:  Diagnosis Date   Anxiety    Depression    HLD (hyperlipidemia) 02/24/2017   Primary osteoarthritis of both knees 01/23/2017    Past Surgical History:  Procedure Laterality Date   COLONOSCOPY WITH PROPOFOL  N/A 04/26/2022   Procedure: COLONOSCOPY WITH PROPOFOL ;  Surgeon: Angela Angelia [name], MD;  [facility];  Service: Gastroenterology;  Laterality: N/A;  730 ASA 1   POLYPECTOMY     TUBAL LIGATION      Current Outpatient Medications  Medication Sig Dispense Refill   acetaminophen   (TYLENOL ) 500 MG tablet Take 1,000 mg by mouth every 6 (six) hours as needed for moderate pain.     Dextromethorphan-buPROPion ER (AUVELITY) 45-105 MG TBCR Take 45 each by mouth. 45mg      lisinopril (ZESTRIL) 5 MG tablet Take 5 mg by mouth daily.     traZODone (DESYREL) 50 MG tablet Take 50 mg by mouth at bedtime.     baclofen  (LIORESAL ) 10 MG tablet Take 1 tablet (10 mg total) by mouth 2 (two) times daily as needed for muscle spasms. (Patient not taking: Reported on 07/06/2024) 30 each 0   lidocaine  (LIDODERM ) 5 % Place 1 patch onto the skin daily. Remove & Discard patch within 12 hours or as directed by MD (Patient not taking: Reported on 07/06/2024) 30 patch 0   No current facility-administered medications for this visit.    Allergies as of 07/06/2024 - Review Complete 07/06/2024  Allergen Reaction Noted   Aspirin Other (See Comments) 01/23/2017   Codeine Nausea And Vomiting 04/17/2022    Social History   Socioeconomic History   Marital status: Single    Spouse name: Not on file   Number of children: 1   Years of education: 15   Highest education level: Not on file  Occupational History   Occupation: Runner, broadcasting/film/video asst    Comment: [employer]  Tobacco Use   Smoking status: Former   Smokeless tobacco: Never  Substance and Sexual Activity   Alcohol use: No   Drug use: No   Sexual activity: Not Currently    Birth control/protection: None  Other Topics Concern   Not on file  Social History Narrative   Lives with mother and son [name]   Working on degree in early childhood interdisciplinary studies   Social Drivers of Corporate investment banker Strain: Not on file  Food Insecurity: Not on file  Transportation Needs: Not on file  Physical Activity: Not on file  Stress: Not on file  Social Connections: Not on file    Review of systems General: negative for malaise, night sweats, fever, chills, weight loss Neck: Negative for lumps, goiter, pain and significant neck  swelling Resp: Negative for cough, wheezing, dyspnea at rest CV: Negative for chest pain, leg swelling, palpitations, orthopnea GI: denies melena, hematochezia, vomiting, diarrhea, constipation, dysphagia, odyonophagia, early satiety or unintentional weight loss. +upper abdominal pain +nausea  MSK: Negative for joint pain or swelling, back pain, and muscle pain. Derm: Negative for itching or rash Psych: Denies depression, anxiety, memory loss, confusion. No homicidal or suicidal ideation.  Heme: Negative for prolonged bleeding, bruising easily, and swollen nodes. Endocrine: Negative for cold or heat intolerance, polyuria, polydipsia and goiter. Neuro: negative for tremor, gait imbalance, syncope and seizures. The remainder of the review of systems is noncontributory.  Physical Exam: LMP 06/27/2020  General:   Alert and oriented. No distress noted. Pleasant and cooperative.  Head:  Normocephalic and atraumatic. Eyes:  Conjuctiva clear without scleral icterus. Mouth:  Oral mucosa pink and moist. Good dentition. No lesions. Heart: Normal rate and rhythm, s1 and s2 heart sounds present.  Lungs: Clear lung sounds in all lobes. Respirations equal and unlabored. Abdomen:  +BS, soft, non-tender and non-distended. No rebound or guarding. No HSM or masses noted. Derm: No palmar erythema or jaundice Msk:  Symmetrical without gross deformities. Normal posture. Extremities:  Without edema. Neurologic:  Alert and  oriented x4 Psych:  Alert and cooperative. Normal mood and affect.  Invalid input(s): 6 MONTHS   ASSESSMENT: [name] is a 53 y.o. female presenting today for upper abdominal pain and nausea  Upper abdominal pain and nausea, somewhat improved with PPI. Can be improved or worsened with food. No melena or rectal bleeding. Denies NSAID use. No radiation of pain. Recommend EGD for further evaluation as I cannot rule out PUD, gastritis, duodenitis, H pylori. Will continue PPI BID,  add carafate 1g QID, continue to avoid NSAIDs. Indications, risks and benefits of procedure discussed in detail with patient. Patient verbalized understanding and is in agreement to proceed with EGD.      PLAN:  -continue protonix 40mg  BID -carafate 1g QID -Schedule EGD, ASA II -continue to avoid NSAIDs  All questions were answered, patient verbalized understanding and is in agreement with plan as outlined above.   Follow Up: 3 months   [name] L. Mariette, MSN, APRN, AGNP-C Adult-Gerontology Nurse Practitioner [facility]  I have reviewed the note and agree with the APP's assessment as described in this progress note  Toribio Fortune, MD Gastroenterology and Hepatology [facility]

## 2024-07-06 NOTE — Progress Notes (Addendum)
 Referring Provider: Shona Norleen PEDLAR, MD Primary Care Physician:  Shona Norleen PEDLAR, MD Primary GI Physician: Dr. Eartha   Chief Complaint  Patient presents with   Abdominal Pain    Having sickness. Pt states she has ulcers   HPI:   [name] is a 53 y.o. female with past medical history of depression, hyperlipidemia and anxiety   Patient presenting today for:  Abdominal pain and nausea   Has been having upper abdominal pain, seeing her PCP for this who started PPI. She notes pain is in mid upper abdomen, comes and goes. No radiation of pain. PPI helps her pain some but she still has it. Sometimes eating makes the pain worse but not everytime. She has been avoiding fried, spicy foods. She does note that having an empty stomach sometimes precipitates the pain. She describes sensation as similar to when you have stomach virus. Has some nausea but no vomiting. No constipation or diarrhea. Denies rectal bleeding or melena. No changes in appetite or weight loss. Denies any new medications prior to onset. No previous history of known PUD. No dysphagia or odynophagia. Is not taking any NSAIDs. Does not drink or smoke.    Last Colonoscopy:04/2022 Diverticulosis in the transverse colon.                           - Non-bleeding internal hemorrhoids.                           - No specimens collected. Last Endoscopy: never   Recommendations:  Repeat TCS 10 years    Past Medical History:  Diagnosis Date   Anxiety    Depression    HLD (hyperlipidemia) 02/24/2017   Primary osteoarthritis of both knees 01/23/2017    Past Surgical History:  Procedure Laterality Date   COLONOSCOPY WITH PROPOFOL  N/A 04/26/2022   Procedure: COLONOSCOPY WITH PROPOFOL ;  Surgeon: [name] [name], MD;  Location[facility];  Service: Gastroenterology;  Laterality: N/A;  730 ASA 1   POLYPECTOMY     TUBAL LIGATION      Current Outpatient Medications  Medication Sig Dispense Refill   acetaminophen   (TYLENOL ) 500 MG tablet Take 1,000 mg by mouth every 6 (six) hours as needed for moderate pain.     Dextromethorphan-buPROPion ER (AUVELITY) 45-105 MG TBCR Take 45 each by mouth. 45mg      lisinopril (ZESTRIL) 5 MG tablet Take 5 mg by mouth daily.     traZODone (DESYREL) 50 MG tablet Take 50 mg by mouth at bedtime.     baclofen  (LIORESAL ) 10 MG tablet Take 1 tablet (10 mg total) by mouth 2 (two) times daily as needed for muscle spasms. (Patient not taking: Reported on 07/06/2024) 30 each 0   lidocaine  (LIDODERM ) 5 % Place 1 patch onto the skin daily. Remove & Discard patch within 12 hours or as directed by MD (Patient not taking: Reported on 07/06/2024) 30 patch 0   No current facility-administered medications for this visit.    Allergies as of 07/06/2024 - Review Complete 07/06/2024  Allergen Reaction Noted   Aspirin Other (See Comments) 01/23/2017   Codeine Nausea And Vomiting 04/17/2022    Social History   Socioeconomic History   Marital status: Single    Spouse name: Not on file   Number of children: 1   Years of education: 15   Highest education level: Not on file  Occupational History   Occupation: Runner, broadcasting/film/video asst    Comment: [employer]  Tobacco Use   Smoking status: Former   Smokeless tobacco: Never  Substance and Sexual Activity   Alcohol use: No   Drug use: No   Sexual activity: Not Currently    Birth control/protection: None  Other Topics Concern   Not on file  Social History Narrative   Lives with mother and son [name]   Working on degree in early childhood interdisciplinary studies   Social Drivers of Corporate investment banker Strain: Not on file  Food Insecurity: Not on file  Transportation Needs: Not on file  Physical Activity: Not on file  Stress: Not on file  Social Connections: Not on file    Review of systems General: negative for malaise, night sweats, fever, chills, weight loss Neck: Negative for lumps, goiter, pain and significant neck  swelling Resp: Negative for cough, wheezing, dyspnea at rest CV: Negative for chest pain, leg swelling, palpitations, orthopnea GI: denies melena, hematochezia, vomiting, diarrhea, constipation, dysphagia, odyonophagia, early satiety or unintentional weight loss. +upper abdominal pain +nausea  MSK: Negative for joint pain or swelling, back pain, and muscle pain. Derm: Negative for itching or rash Psych: Denies depression, anxiety, memory loss, confusion. No homicidal or suicidal ideation.  Heme: Negative for prolonged bleeding, bruising easily, and swollen nodes. Endocrine: Negative for cold or heat intolerance, polyuria, polydipsia and goiter. Neuro: negative for tremor, gait imbalance, syncope and seizures. The remainder of the review of systems is noncontributory.  Physical Exam: LMP 06/27/2020  General:   Alert and oriented. No distress noted. Pleasant and cooperative.  Head:  Normocephalic and atraumatic. Eyes:  Conjuctiva clear without scleral icterus. Mouth:  Oral mucosa pink and moist. Good dentition. No lesions. Heart: Normal rate and rhythm, s1 and s2 heart sounds present.  Lungs: Clear lung sounds in all lobes. Respirations equal and unlabored. Abdomen:  +BS, soft, non-tender and non-distended. No rebound or guarding. No HSM or masses noted. Derm: No palmar erythema or jaundice Msk:  Symmetrical without gross deformities. Normal posture. Extremities:  Without edema. Neurologic:  Alert and  oriented x4 Psych:  Alert and cooperative. Normal mood and affect.  Invalid input(s): 6 MONTHS   ASSESSMENT: [name] is a 53 y.o. female presenting today for upper abdominal pain and nausea  Upper abdominal pain and nausea, somewhat improved with PPI. Can be improved or worsened with food. No melena or rectal bleeding. Denies NSAID use. No radiation of pain. Recommend EGD for further evaluation as I cannot rule out PUD, gastritis, duodenitis, H pylori. Will continue PPI BID,  add carafate 1g QID, continue to avoid NSAIDs. Indications, risks and benefits of procedure discussed in detail with patient. Patient verbalized understanding and is in agreement to proceed with EGD.      PLAN:  -continue protonix 40mg  BID -carafate 1g QID -Schedule EGD, ASA II -continue to avoid NSAIDs  All questions were answered, patient verbalized understanding and is in agreement with plan as outlined above.   Follow Up: 3 months   Rykar Lebleu L. Mariette, MSN, APRN, AGNP-C Adult-Gerontology Nurse Practitioner [facility]  I have reviewed the note and agree with the APP's assessment as described in this progress note  Toribio Fortune, MD Gastroenterology and Hepatology [facility]

## 2024-07-16 ENCOUNTER — Other Ambulatory Visit: Payer: Self-pay

## 2024-07-16 ENCOUNTER — Ambulatory Visit (HOSPITAL_COMMUNITY)
Admission: RE | Admit: 2024-07-16 | Discharge: 2024-07-16 | Disposition: A | Discharge status: Home / Self Care | Attending: Gastroenterology | Admitting: Gastroenterology

## 2024-07-16 ENCOUNTER — Ambulatory Visit (HOSPITAL_COMMUNITY): Payer: Self-pay | Admitting: Certified Registered Nurse Anesthetist

## 2024-07-16 ENCOUNTER — Encounter (HOSPITAL_COMMUNITY)
Admission: RE | Disposition: A | Discharge status: Home / Self Care | Payer: Self-pay | Source: Home / Self Care | Attending: Gastroenterology

## 2024-07-16 ENCOUNTER — Encounter (HOSPITAL_COMMUNITY): Payer: Self-pay | Admitting: Gastroenterology

## 2024-07-16 DIAGNOSIS — K3189 Other diseases of stomach and duodenum: Secondary | ICD-10-CM

## 2024-07-16 DIAGNOSIS — F32A Depression, unspecified: Secondary | ICD-10-CM | POA: Diagnosis not present

## 2024-07-16 DIAGNOSIS — Z87891 Personal history of nicotine dependence: Secondary | ICD-10-CM | POA: Insufficient documentation

## 2024-07-16 DIAGNOSIS — R1010 Upper abdominal pain, unspecified: Secondary | ICD-10-CM | POA: Diagnosis present

## 2024-07-16 DIAGNOSIS — R110 Nausea: Secondary | ICD-10-CM | POA: Insufficient documentation

## 2024-07-16 DIAGNOSIS — M1990 Unspecified osteoarthritis, unspecified site: Secondary | ICD-10-CM | POA: Insufficient documentation

## 2024-07-16 HISTORY — PX: ESOPHAGOGASTRODUODENOSCOPY: SHX5428

## 2024-07-16 SURGERY — EGD (ESOPHAGOGASTRODUODENOSCOPY)
Anesthesia: General

## 2024-07-16 MED ORDER — PROPOFOL 500 MG/50ML IV EMUL
INTRAVENOUS | Status: AC
  Filled 2024-07-16: qty 50

## 2024-07-16 MED ORDER — PROPOFOL 500 MG/50ML IV EMUL
INTRAVENOUS | Status: DC | PRN
  Administered 2024-07-16: 100 mg via INTRAVENOUS
  Administered 2024-07-16: 180 ug/kg/min via INTRAVENOUS

## 2024-07-16 MED ORDER — OMEPRAZOLE 40 MG PO CPDR: 40.0000 mg | DELAYED_RELEASE_CAPSULE | Freq: Every day | ORAL | 3 refills | Status: DC

## 2024-07-16 MED ORDER — LIDOCAINE 2% (20 MG/ML) 5 ML SYRINGE
INTRAMUSCULAR | Status: DC | PRN
  Administered 2024-07-16: 100 mg via INTRAVENOUS

## 2024-07-16 MED ORDER — LIDOCAINE 2% (20 MG/ML) 5 ML SYRINGE
INTRAMUSCULAR | Status: AC
  Filled 2024-07-16: qty 5

## 2024-07-16 MED ORDER — LACTATED RINGERS IV SOLN
INTRAVENOUS | Status: DC | PRN
Start: 2024-07-16 — End: 2024-07-16

## 2024-07-16 NOTE — Op Note (Signed)
 [facility] Patient Name: [name] Procedure Date: 07/16/2024 10:18 AM MRN: [id number] Date of Birth: 1970/10/09 Attending MD: Toribio Fortune , , [id number] CSN: [id number] Age: 53 Admit Type: Outpatient Procedure:                Upper GI endoscopy Indications:              Upper abdominal pain, Nausea Providers:                Toribio Fortune, Leandrew Edelman RN, RN, [place] Wilson,                            Technician Referring MD:              Medicines:                Monitored Anesthesia Care Complications:            No immediate complications. Estimated Blood Loss:     Estimated blood loss: none. Procedure:                Pre-Anesthesia Assessment:                           - Prior to the procedure, a History and Physical                            was performed, and patient medications, allergies                            and sensitivities were reviewed. The patient's                            tolerance of previous anesthesia was reviewed.                           - The risks and benefits of the procedure and the                            sedation options and risks were discussed with the                            patient. All questions were answered and informed                            consent was obtained.                           - ASA Grade Assessment: II - A patient with mild                            systemic disease.                           After obtaining informed consent, the endoscope was                            passed under direct vision. Throughout the  procedure, the patient's blood pressure, pulse, and                            oxygen saturations were monitored continuously. The                            [id number] ([id number] was introduced through the                            mouth, and advanced to the second part of duodenum.                            The upper GI endoscopy was accomplished without                             difficulty. The patient tolerated the procedure                            well. Scope In: 10:46:59 AM Scope Out: 10:56:35 AM Total Procedure Duration: 0 hours 9 minutes 36 seconds  Findings:      The examined esophagus was normal.      Striped mildly erythematous mucosa without bleeding was found in the       gastric antrum. Biopsies were taken with a cold forceps for Helicobacter       pylori testing.      A single 10 mm submucosal papule (nodule) with no bleeding was found on       the greater curvature of the stomach and in the gastric antrum. The       nodule was Paris classification Is (protruding, sessile).       Biopsies-on-biopsies were taken with a cold forceps for histology.      The examined duodenum was normal. Biopsies were taken with a cold       forceps for histology. Impression:               - Normal esophagus.                           - Erythematous mucosa in the antrum. Biopsied.                           - A single submucosal papule (nodule) found in the                            stomach. Biopsied.                           - Normal examined duodenum. Biopsied. Moderate Sedation:      Per Anesthesia Care Recommendation:           - Discharge patient to home (ambulatory).                           - Resume previous diet.                           - Await pathology results.                           -  Start omeprazole 40 mg qday.                           - If negative biopsies from nodule, we will refer                            for non-urgent EUS. Procedure Code(s):        --- Professional ---                           [phone], Esophagogastroduodenoscopy, flexible,                            transoral; with biopsy, single or multiple Diagnosis Code(s):        --- Professional ---                           K31.89, Other diseases of stomach and duodenum                           R10.10, Upper abdominal pain, unspecified                            R11.0, Nausea CPT copyright 2022 American Medical Association. All rights reserved. The codes documented in this report are preliminary and upon coder review may  be revised to meet current compliance requirements. Toribio Fortune, MD Toribio Fortune,  07/16/2024 11:12:26 AM This report has been signed electronically. Number of Addenda: 0

## 2024-07-16 NOTE — Transfer of Care (Signed)
 Immediate Anesthesia Transfer of Care Note  Patient: [name]  Procedure(s) Performed: EGD (ESOPHAGOGASTRODUODENOSCOPY)  Patient Location: Endoscopy Unit  Anesthesia Type:General  Level of Consciousness: awake, alert , and oriented  Airway & Oxygen Therapy: Patient Spontanous Breathing  Post-op Assessment: Report given to RN, Post -op Vital signs reviewed and stable, Patient moving all extremities X 4, and Patient able to stick tongue midline  Post vital signs: Reviewed and stable  Last Vitals:  Vitals Value Taken Time  BP 90/53   Temp 97.7   Pulse 77   Resp 19   SpO2 93     Last Pain:  Vitals:   07/16/24 1042  TempSrc:   PainSc: 0-No pain      Patients Stated Pain Goal: (P) 7 (07/16/24 1010)  Complications: No notable events documented.

## 2024-07-16 NOTE — Interval H&P Note (Signed)
 History and Physical Interval Note:  07/16/2024 9:51 AM  [name]  has presented today for surgery, with the diagnosis of upper abdominal pain, nausea.  The various methods of treatment have been discussed with the patient and family. After consideration of risks, benefits and other options for treatment, the patient has consented to  Procedure(s) with comments: EGD (ESOPHAGOGASTRODUODENOSCOPY) (N/A) - 11:30 am, ASA 2 as a surgical intervention.  The patient's history has been reviewed, patient examined, no change in status, stable for surgery.  I have reviewed the patient's chart and labs.  Questions were answered to the patient's satisfaction.     [name]

## 2024-07-16 NOTE — Anesthesia Postprocedure Evaluation (Signed)
 Anesthesia Post Note  Patient: [name]  Procedure(s) Performed: EGD (ESOPHAGOGASTRODUODENOSCOPY)  Patient location during evaluation: PACU Anesthesia Type: General Level of consciousness: awake and alert Pain management: pain level controlled Vital Signs Assessment: post-procedure vital signs reviewed and stable Respiratory status: spontaneous breathing, nonlabored ventilation and respiratory function stable Cardiovascular status: blood pressure returned to baseline and stable Postop Assessment: no apparent nausea or vomiting Anesthetic complications: no   No notable events documented.   Last Vitals:  Vitals:   07/16/24 1103 07/16/24 1121  BP: (!) 90/53 122/73  Pulse: 81 65  Resp: (!) 21 (!) 21  Temp: 36.5 C   SpO2: 93% 99%    Last Pain:  Vitals:   07/16/24 1121  TempSrc:   PainSc: 0-No pain                 Andrea Limes

## 2024-07-16 NOTE — Discharge Instructions (Signed)
You are being discharged to home.  Resume your previous diet.  We are waiting for your pathology results.  Start omeprazole 40 mg qday.

## 2024-07-16 NOTE — Anesthesia Preprocedure Evaluation (Addendum)
 Anesthesia Evaluation  Patient identified by MRN, date of birth, ID band Patient awake    Reviewed: Allergy & Precautions, H&P , NPO status , Patient's Chart, lab work & pertinent test results  Airway Mallampati: III  TM Distance: >3 FB Neck ROM: Full    Dental no notable dental hx.    Pulmonary former smoker   Pulmonary exam normal breath sounds clear to auscultation       Cardiovascular negative cardio ROS Normal cardiovascular exam Rhythm:Regular Rate:Normal     Neuro/Psych  PSYCHIATRIC DISORDERS Anxiety Depression    negative neurological ROS     GI/Hepatic negative GI ROS, Neg liver ROS,,,  Endo/Other  negative endocrine ROS    Renal/GU negative Renal ROS  negative genitourinary   Musculoskeletal  (+) Arthritis ,    Abdominal   Peds negative pediatric ROS (+)  Hematology negative hematology ROS (+)   Anesthesia Other Findings   Reproductive/Obstetrics negative OB ROS                              Anesthesia Physical Anesthesia Plan  ASA: 2  Anesthesia Plan: General   Post-op Pain Management:    Induction: Intravenous  PONV Risk Score and Plan:   Airway Management Planned: Nasal Cannula  Additional Equipment:   Intra-op Plan:   Post-operative Plan:   Informed Consent: I have reviewed the patients History and Physical, chart, labs and discussed the procedure including the risks, benefits and alternatives for the proposed anesthesia with the patient or authorized representative who has indicated his/her understanding and acceptance.     Dental advisory given  Plan Discussed with: CRNA  Anesthesia Plan Comments:          Anesthesia Quick Evaluation

## 2024-07-20 ENCOUNTER — Ambulatory Visit (INDEPENDENT_AMBULATORY_CARE_PROVIDER_SITE_OTHER): Payer: Self-pay | Admitting: Gastroenterology

## 2024-07-20 ENCOUNTER — Encounter (HOSPITAL_COMMUNITY): Payer: Self-pay | Admitting: Gastroenterology

## 2024-07-20 LAB — SURGICAL PATHOLOGY

## 2024-07-21 ENCOUNTER — Encounter (INDEPENDENT_AMBULATORY_CARE_PROVIDER_SITE_OTHER): Payer: Self-pay | Admitting: Gastroenterology

## 2024-07-21 NOTE — Progress Notes (Signed)
 Message sent to patty for EUS procedure note and pathology result faxed to PCP

## 2024-07-22 ENCOUNTER — Telehealth: Payer: Self-pay

## 2024-07-22 NOTE — Telephone Encounter (Signed)
-----   Message from [facility] sent at 07/22/2024  3:28 PM EDT ----- Regarding: RE:  Odetta Re plan for late December or January procedure on this patient for Upper EUS. Thanks. GM ----- Message ----- From: [name] [name], MD Sent: 07/21/2024   5:51 PM EDT To: Aloha Wilhelmenia Raddle., MD Subject: RE:                                            Erskin Aloha, Yeah, she should be good for that time. Thanks, [name] ----- Message ----- From: Wilhelmenia Aloha Raddle., MD Sent: 07/21/2024   4:58 PM EDT To: Odetta LITTIE Curly, RN; Jenkins VEAR [place]; Daniel Cas#  DCM, Are you OK for a January procedure? GM ----- Message ----- From: Curly Odetta LITTIE, RN Sent: 07/21/2024   9:44 AM EDT To: Aloha Wilhelmenia Raddle., MD   ----- Message ----- From: [place] Jenkins VEAR Sent: 07/21/2024   9:27 AM EDT To: Odetta LITTIE Curly, RN  Per Dr Eartha patient needs non urgent EUS. Dx: submucosal gastric lesion. Thanks Jenkins

## 2024-07-23 ENCOUNTER — Other Ambulatory Visit: Payer: Self-pay

## 2024-07-23 DIAGNOSIS — K319 Disease of stomach and duodenum, unspecified: Secondary | ICD-10-CM

## 2024-07-23 NOTE — Telephone Encounter (Signed)
 EUS scheduled, pt instructed and medications reviewed.  Patient instructions mailed to home.  Patient to call with any questions or concerns.

## 2024-07-23 NOTE — Telephone Encounter (Signed)
 EUS has been entered for 09/20/24 at 8 am at [facility] with GM   Left message on machine to call back

## 2024-09-13 ENCOUNTER — Encounter (HOSPITAL_COMMUNITY): Payer: Self-pay | Admitting: Gastroenterology

## 2024-09-13 NOTE — Progress Notes (Signed)
 Attempted to obtain medical history for pre op call via telephone, unable to reach at this time. HIPAA compliant voicemail message left requesting return call to pre surgical testing department.

## 2024-09-19 NOTE — Anesthesia Preprocedure Evaluation (Signed)
 Anesthesia Evaluation  Patient identified by MRN, date of birth, ID band Patient awake    Reviewed: Allergy & Precautions, NPO status , Patient's Chart, lab work & pertinent test results  Airway Mallampati: III  TM Distance: >3 FB Neck ROM: Full    Dental  (+) Teeth Intact, Dental Advisory Given   Pulmonary former smoker   Pulmonary exam normal breath sounds clear to auscultation       Cardiovascular hypertension, Pt. on medications Normal cardiovascular exam Rhythm:Regular Rate:Normal     Neuro/Psych  PSYCHIATRIC DISORDERS Anxiety Depression    negative neurological ROS     GI/Hepatic Neg liver ROS,GERD  Medicated,,Gastric lesion   Endo/Other    Class 3 obesity  Renal/GU negative Renal ROS     Musculoskeletal  (+) Arthritis ,    Abdominal   Peds  Hematology negative hematology ROS (+)   Anesthesia Other Findings   Reproductive/Obstetrics                              Anesthesia Physical Anesthesia Plan  ASA: 3  Anesthesia Plan: MAC   Post-op Pain Management: Minimal or no pain anticipated   Induction: Intravenous  PONV Risk Score and Plan: 2 and TIVA and Treatment may vary due to age or medical condition  Airway Management Planned: Simple Face Mask and Natural Airway  Additional Equipment:   Intra-op Plan:   Post-operative Plan:   Informed Consent: I have reviewed the patients History and Physical, chart, labs and discussed the procedure including the risks, benefits and alternatives for the proposed anesthesia with the patient or authorized representative who has indicated his/her understanding and acceptance.     Dental advisory given  Plan Discussed with: CRNA  Anesthesia Plan Comments:          Anesthesia Quick Evaluation

## 2024-09-20 ENCOUNTER — Encounter (HOSPITAL_COMMUNITY): Payer: Self-pay | Admitting: Gastroenterology

## 2024-09-20 ENCOUNTER — Ambulatory Visit (HOSPITAL_COMMUNITY)
Admission: RE | Admit: 2024-09-20 | Discharge: 2024-09-20 | Disposition: A | Attending: Gastroenterology | Admitting: Gastroenterology

## 2024-09-20 ENCOUNTER — Encounter (HOSPITAL_COMMUNITY): Payer: Self-pay | Admitting: Anesthesiology

## 2024-09-20 ENCOUNTER — Encounter (HOSPITAL_COMMUNITY): Admission: RE | Disposition: A | Payer: Self-pay | Source: Home / Self Care | Attending: Gastroenterology

## 2024-09-20 ENCOUNTER — Other Ambulatory Visit: Payer: Self-pay

## 2024-09-20 ENCOUNTER — Ambulatory Visit (HOSPITAL_COMMUNITY): Payer: Self-pay | Admitting: Anesthesiology

## 2024-09-20 DIAGNOSIS — F32A Depression, unspecified: Secondary | ICD-10-CM | POA: Insufficient documentation

## 2024-09-20 DIAGNOSIS — K319 Disease of stomach and duodenum, unspecified: Secondary | ICD-10-CM

## 2024-09-20 DIAGNOSIS — Z87891 Personal history of nicotine dependence: Secondary | ICD-10-CM | POA: Diagnosis not present

## 2024-09-20 DIAGNOSIS — K3189 Other diseases of stomach and duodenum: Secondary | ICD-10-CM

## 2024-09-20 DIAGNOSIS — K2970 Gastritis, unspecified, without bleeding: Secondary | ICD-10-CM | POA: Diagnosis not present

## 2024-09-20 DIAGNOSIS — K317 Polyp of stomach and duodenum: Secondary | ICD-10-CM

## 2024-09-20 DIAGNOSIS — K209 Esophagitis, unspecified without bleeding: Secondary | ICD-10-CM

## 2024-09-20 DIAGNOSIS — K2289 Other specified disease of esophagus: Secondary | ICD-10-CM | POA: Diagnosis not present

## 2024-09-20 DIAGNOSIS — M1990 Unspecified osteoarthritis, unspecified site: Secondary | ICD-10-CM | POA: Insufficient documentation

## 2024-09-20 DIAGNOSIS — D175 Benign lipomatous neoplasm of intra-abdominal organs: Secondary | ICD-10-CM | POA: Diagnosis not present

## 2024-09-20 DIAGNOSIS — I10 Essential (primary) hypertension: Secondary | ICD-10-CM | POA: Diagnosis not present

## 2024-09-20 DIAGNOSIS — I899 Noninfective disorder of lymphatic vessels and lymph nodes, unspecified: Secondary | ICD-10-CM

## 2024-09-20 DIAGNOSIS — F419 Anxiety disorder, unspecified: Secondary | ICD-10-CM | POA: Diagnosis not present

## 2024-09-20 DIAGNOSIS — K219 Gastro-esophageal reflux disease without esophagitis: Secondary | ICD-10-CM | POA: Diagnosis not present

## 2024-09-20 DIAGNOSIS — E66813 Obesity, class 3: Secondary | ICD-10-CM | POA: Diagnosis not present

## 2024-09-20 DIAGNOSIS — K449 Diaphragmatic hernia without obstruction or gangrene: Secondary | ICD-10-CM

## 2024-09-20 DIAGNOSIS — K229 Disease of esophagus, unspecified: Secondary | ICD-10-CM

## 2024-09-20 DIAGNOSIS — Z6841 Body Mass Index (BMI) 40.0 and over, adult: Secondary | ICD-10-CM | POA: Diagnosis not present

## 2024-09-20 HISTORY — PX: ESOPHAGOGASTRODUODENOSCOPY: SHX5428

## 2024-09-20 HISTORY — PX: EUS: SHX5427

## 2024-09-20 SURGERY — EGD (ESOPHAGOGASTRODUODENOSCOPY)
Anesthesia: Monitor Anesthesia Care

## 2024-09-20 MED ORDER — ESOMEPRAZOLE MAGNESIUM 40 MG PO CPDR: 40.0000 mg | DELAYED_RELEASE_CAPSULE | Freq: Two times a day (BID) | ORAL | 6 refills | Status: AC

## 2024-09-20 MED ORDER — PROPOFOL 500 MG/50ML IV EMUL
INTRAVENOUS | Status: DC | PRN
  Administered 2024-09-20: 08:00:00 100 mg via INTRAVENOUS
  Administered 2024-09-20: 08:00:00 150 mg via INTRAVENOUS
  Administered 2024-09-20: 08:00:00 140 ug/kg/min via INTRAVENOUS

## 2024-09-20 MED ORDER — OMEPRAZOLE 40 MG PO CPDR: 40.0000 mg | DELAYED_RELEASE_CAPSULE | Freq: Every day | ORAL | 12 refills | Status: DC

## 2024-09-20 MED ORDER — PROPOFOL 500 MG/50ML IV EMUL
INTRAVENOUS | Status: AC
  Filled 2024-09-20: qty 150

## 2024-09-20 MED ORDER — SODIUM CHLORIDE 0.9 % IV SOLN: INTRAVENOUS | Status: DC

## 2024-09-20 NOTE — Discharge Instructions (Signed)

## 2024-09-20 NOTE — Anesthesia Postprocedure Evaluation (Signed)
 Anesthesia Post Note  Patient: [name]  Procedure(s) Performed: ULTRASOUND, UPPER GI TRACT, ENDOSCOPIC EGD (ESOPHAGOGASTRODUODENOSCOPY)     Patient location during evaluation: Endoscopy Anesthesia Type: MAC Level of consciousness: oriented, awake and alert and awake Pain management: pain level controlled Vital Signs Assessment: post-procedure vital signs reviewed and stable Respiratory status: spontaneous breathing, nonlabored ventilation, respiratory function stable and patient connected to nasal cannula oxygen Cardiovascular status: blood pressure returned to baseline and stable Postop Assessment: no headache, no backache and no apparent nausea or vomiting Anesthetic complications: no   No notable events documented.  Last Vitals:  Vitals:   09/20/24 0850 09/20/24 0900  BP: (!) 141/64 136/71  Pulse: 85 77  Resp: (!) 21 17  Temp:  36.4 C  SpO2: 99% 99%    Last Pain:  Vitals:   09/20/24 0900  TempSrc: Axillary  PainSc: 0-No pain                 Garnette FORBES Skillern

## 2024-09-20 NOTE — Progress Notes (Signed)
3 mth EGD noted in recall 

## 2024-09-20 NOTE — Transfer of Care (Signed)
 Immediate Anesthesia Transfer of Care Note  Patient: [name]  Procedure(s) Performed: ULTRASOUND, UPPER GI TRACT, ENDOSCOPIC EGD (ESOPHAGOGASTRODUODENOSCOPY)  Patient Location: PACU  Anesthesia Type:MAC  Level of Consciousness: awake, alert , and oriented  Airway & Oxygen Therapy: Patient Spontanous Breathing  Post-op Assessment: Report given to RN and Post -op Vital signs reviewed and stable  Post vital signs: Reviewed and stable  Last Vitals:  Vitals Value Taken Time  BP 141/64 09/20/24 08:50  Temp 36.4 C 09/20/24 08:38  Pulse 77 09/20/24 08:59  Resp 17 09/20/24 08:59  SpO2 99 % 09/20/24 08:59  Vitals shown include unfiled device data.  Last Pain:  Vitals:   09/20/24 0844  TempSrc:   PainSc: 0-No pain         Complications: No notable events documented.

## 2024-09-20 NOTE — H&P (Signed)
 GASTROENTEROLOGY PROCEDURE H&P NOTE   Primary Care Physician: Shona Norleen PEDLAR, MD  HPI: [name] is a 53 y.o. 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  Past Medical History:  Diagnosis Date   Anxiety    Depression    HLD (hyperlipidemia) 02/24/2017   Primary osteoarthritis of both knees 01/23/2017   Past Surgical History:  Procedure Laterality Date   COLONOSCOPY WITH PROPOFOL  N/A 04/26/2022   Procedure: COLONOSCOPY WITH PROPOFOL ;  Surgeon: [name] [name], MD;  Location: [facility];  Service: Gastroenterology;  Laterality: N/A;  730 ASA 1   ESOPHAGOGASTRODUODENOSCOPY N/A 07/16/2024   Procedure: EGD (ESOPHAGOGASTRODUODENOSCOPY);  Surgeon: [name], [name], MD;  Location: [facility];  Service: Gastroenterology;  Laterality: N/A;  11:30 am, ASA 2   POLYPECTOMY     TUBAL LIGATION     Current Facility-Administered Medications  Medication Dose Route Frequency Provider Last Rate Last Admin   0.9 %  sodium chloride  infusion   Intravenous Continuous Mansouraty, Aloha Raddle., MD 20 mL/hr at 09/20/24 0706 New Bag at 09/20/24 0706   Current Medications[1] Allergies[2] Family History  Problem Relation Age of Onset   Diabetes Mother    Hypertension Mother    Stroke Father    Dementia Father    Early death Brother        MVA   Social History   Socioeconomic History   Marital status: Single    Spouse name: Not on file   Number of children: 1   Years of education: 15   Highest education level: Not on file  Occupational History   Occupation: runner, broadcasting/film/video asst    Comment: [employer]  Tobacco Use   Smoking status: Former   Smokeless tobacco: Never  Advertising Account Planner   Vaping status: Never Used  Substance and Sexual Activity   Alcohol use: No   Drug use: No   Sexual activity: Not Currently    Birth control/protection: None  Other Topics Concern   Not on file  Social History Narrative   Lives with mother and son [name]   Working on degree  in early childhood interdisciplinary studies   Social Drivers of Health   Tobacco Use: Medium Risk (09/20/2024)   Patient History    Smoking Tobacco Use: Former    Smokeless Tobacco Use: Never    Passive Exposure: Not on Actuary Strain: Not on file  Food Insecurity: Not on file  Transportation Needs: Not on file  Physical Activity: Not on file  Stress: Not on file  Social Connections: Not on file  Intimate Partner Violence: Not on file  Depression (PHQ2-9): Medium Risk (06/17/2022)   Depression (PHQ2-9)    PHQ-2 Score: 8  Alcohol Screen: Not on file  Housing: Not on file  Utilities: Not on file  Health Literacy: Not on file    Physical Exam: Today's Vitals   09/14/24 0959 09/20/24 0631  BP:  (!) 153/77  Pulse:  (!) 57  Resp:  14  Temp:  (!) 97.3 F (36.3 C)  TempSrc:  Temporal  SpO2:  98%  Weight: (!) 137 kg (!) 137 kg  Height:  5' 9 (1.753 m)  PainSc:  0-No pain   Body mass index is 44.6 kg/m. GEN: NAD EYE: Sclerae anicteric ENT: MMM CV: Non-tachycardic GI: Soft, NT/ND NEURO:  Alert & Oriented x 3  Lab Results: No results for input(s): WBC, HGB, HCT, PLT in the last 72 hours. BMET No results for input(s):  NA, K, CL, CO2, GLUCOSE, BUN, CREATININE, CALCIUM in the last 72 hours. LFT No results for input(s): PROT, ALBUMIN, AST, ALT, ALKPHOS, BILITOT, BILIDIR, IBILI in the last 72 hours. PT/INR No results for input(s): LABPROT, INR in the last 72 hours.   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  The risks of an EUS including intestinal perforation, bleeding, infection, aspiration, and medication effects were discussed as was the possibility it may not give a definitive diagnosis if a biopsy is performed.  When a biopsy of the pancreas is done as part of the EUS, there is an additional risk of pancreatitis at the rate of about 1-2%.  It was  explained that procedure related pancreatitis is typically mild, although it can be severe and even life threatening, which is why we do not perform random pancreatic biopsies and only biopsy a lesion/area we feel is concerning enough to warrant the risk.   The risks and benefits of endoscopic evaluation/treatment were discussed with the patient and/or family; these include but are not limited to the risk of perforation, infection, bleeding, missed lesions, lack of diagnosis, severe illness requiring hospitalization, as well as anesthesia and sedation related illnesses.  The patient's history has been reviewed, patient examined, no change in status, and deemed stable for procedure.  The patient and/or family was provided an opportunity to ask questions and all were answered.  The patient and/or family is agreeable to proceed.    [name], MD [facility] Office # [phone]     [1]  Current Facility-Administered Medications:    0.9 %  sodium chloride  infusion, , Intravenous, Continuous, Mansouraty, Aloha Raddle., MD, Last Rate: 20 mL/hr at 09/20/24 0706, New Bag at 09/20/24 0706 [2]  Allergies Allergen Reactions   Aspirin Other (See Comments)    Bloody stools    Codeine Nausea And Vomiting

## 2024-09-20 NOTE — Op Note (Addendum)
 [facility] Patient Name: [name] Procedure Date: 09/20/2024 MRN: [id number] Attending MD: [name] , MD, [id number] Date of Birth: [birth year] CSN: [id number] Age: 53 Admit Type: Outpatient Procedure:                Upper EUS Indications:              Gastric deformity on endoscopy/Subepithelial tumor                            versus extrinsic compression Providers:                [name], MD, Randall Lines, RN, Farris Southgate, Technician, Lorrayne Kitty, Technician Referring MD:              Medicines:                Monitored Anesthesia Care Complications:            No immediate complications. Estimated Blood Loss:     Estimated blood loss was minimal. Procedure:                Pre-Anesthesia Assessment:                           - Prior to the procedure, a History and Physical                            was performed, and patient medications and                            allergies were reviewed. The patient's tolerance of                            previous anesthesia was also reviewed. The risks                            and benefits of the procedure and the sedation                            options and risks were discussed with the patient.                            All questions were answered, and informed consent                            was obtained. Prior Anticoagulants: The patient has                            taken no anticoagulant or antiplatelet agents. ASA                            Grade Assessment: II - A patient with mild systemic  disease. After reviewing the risks and benefits,                            the patient was deemed in satisfactory condition to                            undergo the procedure.                           After obtaining informed consent, the endoscope was                            passed under direct vision. Throughout the                             procedure, the patient's blood pressure, pulse, and                            oxygen saturations were monitored continuously. The                            [id number] ([id number]) Olympus endoscope was                            introduced through the mouth, and advanced to the                            second part of duodenum. The [id number] ([id number])                            Olympus endosonoscope was introduced through the                            mouth, and advanced to the duodenum for ultrasound                            examination from the esophagus, stomach and                            duodenum. The upper EUS was accomplished without                            difficulty. The patient tolerated the procedure. Scope In: Scope Out: Findings:      ENDOSCOPIC FINDING: :      No gross lesions were noted in the proximal esophagus and in the mid       esophagus.      A single 5 mm submucosal nodule was found in the distal esophagus, 35 cm       from the incisors.      LA Grade B (one or more mucosal breaks greater than 5 mm, not extending       between the tops of two mucosal folds) esophagitis with no bleeding was       found in the very distal esophagus.      The Z-line was irregular and  was found 36 cm from the incisors.      A 1 cm hiatal hernia was present.      Multiple small sessile polyps were found in the gastric fundus and in       the gastric body (consistent with fundic gland polyps).      A single 10 mm subepithelial papule (nodule) was found in the gastric       antrum.      Segmental mild inflammation characterized by erosions, erythema and       granularity was found in the entire examined stomach. This is previously       been biopsied by other provider so not redone today.      No gross lesions were noted in the duodenal bulb, in the first portion       of the duodenum and in the second portion of the duodenum.      ENDOSONOGRAPHIC FINDING: :       An oval intramural (subepithelial) lesion was found in the thoracic       esophagus. It was encountered at 35 cm from the incisors. The lesion was       anechoic. Sonographically, the origin appeared to be within the deep       mucosa (Layer 2). The lesion measured up to 4.3 x 1.6 mm in diameter.       The endosonographic borders were well-defined. This is consistent with a       cyst.      Endosonographic imaging in the rest of the thoracic esophagus and in the       gastroesophageal junction showed no wall thickening.      A round intramural (subepithelial) lesion was found in the antrum of the       stomach. The lesion was isoechoic/nearly hyperechoic. Sonographically,       the lesion appeared to originate from the submucosa (Layer 3). The       lesion also appeared to involve the following wall layer(s): submucosa       (Layer 3). The lesion measured 11 mm (in maximum thickness). The lesion       also measured 7 mm in diameter. The outer endosonographic borders were       well defined. This is consistent with a lipoma.      Endosonographic imaging in the rest of the stomach showed no intramural       (subepithelial) lesion.      Endosonographic imaging in the duodenal bulb and in the apex of the       duodenal bulb showed no intramural (subepithelial) lesion.      Endosonographic imaging in the visualized portion of the liver showed no       mass.      No malignant-appearing lymph nodes were visualized in the celiac region       (level 20) and perigastric region.      The celiac region was visualized.      The esophagus, stomach and duodenum were examined endosonographically. Impression:               EGD impression:                           - No gross lesions in the proximal esophagus and in  the mid esophagus.                           - Submucosal nodule found in the distal esophagus.                           - LA Grade B esophagitis with no bleeding  found                            distally.                           - Z-line irregular, 36 cm from the incisors.                           - 1 cm hiatal hernia.                           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                           - A single subepithelial papule (nodule) found in                            the antrum of the stomach.                           - Mild gastritis noted throughout.                           - No gross lesions in the duodenal bulb, in the                            first portion of the duodenum and in the second                            portion of the duodenum.                           EUS impression:                           - An intramural (subepithelial) lesion was found in                            the thoracic esophagus. It appeared to originate                            from within the deep mucosa (Layer 2). Tissue has                            not been obtained. However, the endosonographic  appearance is consistent with a benign intramural                            cyst.                           - An intramural (subepithelial) lesion was found in                            the antrum of the stomach. The lesion appeared to                            originate from within the submucosa (Layer 3).                            Tissue has not been obtained. However, the                            endosonographic appearance is consistent with a                            lipoma.                           - No other visualized thickening of the wall of the                            stomach endosonographically.                           - No malignant-appearing lymph nodes were                            visualized in the celiac region (level 20) and                            perigastric region. Moderate Sedation:      Not Applicable - Patient had care per  Anesthesia. Recommendation:           - The patient will be observed post-procedure,                            until all discharge criteria are met.                           - Discharge patient to home.                           - Patient has a contact number available for                            emergencies. The signs and symptoms of potential                            delayed complications were discussed with the  patient. Return to normal activities tomorrow.                            Written discharge instructions were provided to the                            patient.                           - Resume previous diet.                           - Observe patient's clinical course.                           - No further follow-up or surveillance is required                            of these 2 areas.                           - Follow-up with referring provider.                           - Referring provider can consider repeat upper                            endoscopy in 3 to 4 months for follow-up of                            esophagitis. Talking with patient post-procedure,                            she is not taking any PPI as she said that it                            actually worsened her abdominal pain. I will switch                            her to Nexium  40 mg twice daily to heal the                            esophagus. If she has further issues or cannot                            tolerate this, then she will need to discuss with                            primary GI next steps in her treatment algorithm.                           - The findings and recommendations were discussed  with the patient.                           - The findings and recommendations were discussed                            with the patient's family. Procedure Code(s):        --- Professional ---                           [phone],  Esophagogastroduodenoscopy, flexible,                            transoral; with endoscopic ultrasound examination,                            including the esophagus, stomach, and either the                            duodenum or a surgically altered stomach where the                            jejunum is examined distal to the anastomosis Diagnosis Code(s):        --- Professional ---                           K22.89, Other specified disease of esophagus                           K20.90, Esophagitis, unspecified without bleeding                           K44.9, Diaphragmatic hernia without obstruction or                            gangrene                           K31.7, Polyp of stomach and duodenum                           K31.89, Other diseases of stomach and duodenum                           K29.70, Gastritis, unspecified, without bleeding                           I89.9, Noninfective disorder of lymphatic vessels                            and lymph nodes, unspecified CPT copyright 2022 American Medical Association. All rights reserved. The codes documented in this report are preliminary and upon coder review may  be revised to meet current compliance requirements. [name], MD 09/20/2024 8:41:32 AM Number of Addenda: 0

## 2024-09-23 ENCOUNTER — Telehealth (HOSPITAL_COMMUNITY): Payer: Self-pay

## 2024-09-23 NOTE — Telephone Encounter (Signed)
 Patient contacted endoscopy at hospital requesting work note for procedure on 09/20/24 with Dr. Wilhelmenia.  She states she just returned to work and would like a work note for 12/15-12/17 emailed to her  Informed patient would send to Dr. Wilhelmenia and have office follow up.  Patient verbalized understanding.

## 2024-09-23 NOTE — Telephone Encounter (Signed)
 Her procedure was performed on 12/15 and no significant issues were found during this procedure. We can give her dates of 12/15-12/16, I'm not sure about what would have kept her out more than that - unless you hear more from her. You may email/MyChart her the letter. Thanks. GM

## 2024-09-23 NOTE — Telephone Encounter (Signed)
 Ok to send note for 12/15-12/17?

## 2024-09-23 NOTE — Telephone Encounter (Signed)
 Letter sent to My Chart- pt aware

## 2024-10-01 ENCOUNTER — Other Ambulatory Visit (HOSPITAL_COMMUNITY): Payer: Self-pay | Admitting: Internal Medicine

## 2024-10-01 DIAGNOSIS — Z1231 Encounter for screening mammogram for malignant neoplasm of breast: Secondary | ICD-10-CM

## 2024-10-18 ENCOUNTER — Encounter (INDEPENDENT_AMBULATORY_CARE_PROVIDER_SITE_OTHER): Payer: Self-pay | Admitting: Gastroenterology

## 2024-10-18 ENCOUNTER — Ambulatory Visit (INDEPENDENT_AMBULATORY_CARE_PROVIDER_SITE_OTHER): Admitting: Gastroenterology

## 2024-10-18 VITALS — BP 131/69 | HR 72 | Temp 97.1°F | Ht 69.0 in | Wt 308.0 lb

## 2024-10-18 DIAGNOSIS — K2970 Gastritis, unspecified, without bleeding: Secondary | ICD-10-CM | POA: Insufficient documentation

## 2024-10-18 DIAGNOSIS — R1010 Upper abdominal pain, unspecified: Secondary | ICD-10-CM

## 2024-10-18 DIAGNOSIS — R110 Nausea: Secondary | ICD-10-CM

## 2024-10-18 DIAGNOSIS — K2289 Other specified disease of esophagus: Secondary | ICD-10-CM | POA: Diagnosis not present

## 2024-10-18 MED ORDER — SUCRALFATE 1 GM/10ML PO SUSP: 1.0000 g | Freq: Four times a day (QID) | ORAL | 1 refills | Status: AC

## 2024-10-18 NOTE — Progress Notes (Signed)
 "  Referring Provider: Shona Norleen PEDLAR, MD Primary Care Physician:  Shona Norleen PEDLAR, MD Primary GI Physician: (Dr. Eartha Previously) Dr. Cinderella   Chief Complaint  Patient presents with   Follow-up    Pt arrives for follow up on abdominal pain. Will still have pain at times but for most part feeling well. Having some nausea. Is taking Nexium  BID. Had EUS in December.     HPI:   JULIET VASBINDER is a 54 y.o. female with past medical history of depression, hyperlipidemia and anxiety   Patient presenting today for:  Follow up of upper abdominal pain and nausea  Last seen September, at that time having some upper abdominal pain, started PPI avoiding spicy foods, some nasuea but no vomiting   Recommended to continue with protonix 40mg  BID, carafate  1g QID, schedule EGD  EGD 07/16/24- Normal esophagus.                           - Erythematous mucosa in the antrum. Biopsied.                           - A single submucosal papule (nodule) found in the                            stomach. Biopsied.                           - Normal examined duodenum. Biopsied.  A. DUODENAL, BIOPSY:  - Small bowel mucosa with no specific pathologic change.  - Negative for intraepithelial lymphocytosis or villous blunting.   B. GASTRIC, BIOPSY:  - Benign gastric mucosa with no specific pathologic change.  - No evidence of H. pylori on HE stain.   C. SUBMUCOSAL NODULE, BIOPSY:  - Benign gastric mucosa with no specific pathologic change.  - Limited submucosal tissue is present with deeper levels examined.   Referred for EUS which was performed on 09/20/24  EGD impression:                           - No gross lesions in the proximal esophagus and in                            the mid esophagus.                           - Submucosal nodule found in the distal esophagus.                           - LA Grade B esophagitis with no bleeding found                            distally.                            - Z-line irregular, 36 cm from the incisors.                           - 1 cm hiatal hernia.                           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                           - A single subepithelial papule (nodule) found in                            the antrum of the stomach.                           - Mild gastritis noted throughout.                           - No gross lesions in the duodenal bulb, in the                            first portion of the duodenum and in the second                            portion of the duodenum.                           EUS impression:                           - An intramural (subepithelial) lesion was found in                            the thoracic esophagus. It appeared to originate                            from within the deep mucosa (Layer 2). Tissue has                            not been obtained. However, the endosonographic                            appearance is consistent with a benign intramural                            cyst.                           - An intramural (subepithelial) lesion was found in                            the antrum of the stomach. The lesion appeared to                            originate from within the submucosa (Layer 3).                            Tissue has not been obtained. However, the  endosonographic appearance is consistent with a                            lipoma.                           - No other visualized thickening of the wall of the                            stomach endosonographically.                           - No malignant-appearing lymph nodes were                            visualized in the celiac region (level 20) and                            perigastric region.  Started on nexium  40mg  BID, no follow up EUS recommended   Present:  States still having some abdominal discomfort, feels like pain  is in the pit of her stomach.  States she is taking nexium  40mg  BID. She feels there is not much rhyme or reason to when she has pain. She continues to have pain maybe 2-3 days out of the week. Has taken some tylenol  which seemed to help some. She feels sometimes that stress brings her pain on. 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 She has some nausea at times but no vomiting. Feels appetite is good. Sometimes feels that eating improves pain.no rectal bleeding or melena. She took carafate , thinks it may have helped some but was unsure if this provided much improvement for her. No radiation of pain into her back.    Last Colonoscopy:04/2022 Diverticulosis in the transverse colon.                           - Non-bleeding internal hemorrhoids.                           - No specimens collected. Last Endoscopy: never    Recommendations:  Repeat TCS 10 years  Filed Weights   10/18/24 1049  Weight: (!) 308 lb (139.7 kg)     Past Medical History:  Diagnosis Date   Anxiety    Depression    HLD (hyperlipidemia) 02/24/2017   Primary osteoarthritis of both knees 01/23/2017    Past Surgical History:  Procedure Laterality Date   COLONOSCOPY     COLONOSCOPY WITH PROPOFOL  N/A 04/26/2022   Procedure: COLONOSCOPY WITH PROPOFOL ;  Surgeon: [name] [name], MD;  Location: [facility];  Service: Gastroenterology;  Laterality: N/A;  730 ASA 1   ESOPHAGOGASTRODUODENOSCOPY N/A 07/16/2024   Procedure: EGD (ESOPHAGOGASTRODUODENOSCOPY);  Surgeon: [name], [name], MD;  Location: [facility];  Service: Gastroenterology;  Laterality: N/A;  11:30 am, ASA 2   ESOPHAGOGASTRODUODENOSCOPY N/A 09/20/2024   Procedure: EGD (ESOPHAGOGASTRODUODENOSCOPY);  Surgeon: Wilhelmenia Aloha Raddle., MD;  Location: [facility];  Service: Gastroenterology;  Laterality: N/A;   EUS N/A 09/20/2024   Procedure: ULTRASOUND, UPPER GI TRACT, ENDOSCOPIC;  Surgeon: Wilhelmenia  Aloha Raddle., MD;  Location:  [facility];  Service: Gastroenterology;  Laterality: N/A;   POLYPECTOMY     TUBAL LIGATION     UPPER GASTROINTESTINAL ENDOSCOPY      Current Outpatient Medications  Medication Sig Dispense Refill   acetaminophen  (TYLENOL ) 500 MG tablet Take 1,000 mg by mouth every 6 (six) hours as needed for moderate pain.     atorvastatin (LIPITOR) 20 MG tablet Take 20 mg by mouth daily.     baclofen  (LIORESAL ) 10 MG tablet Take 1 tablet (10 mg total) by mouth 2 (two) times daily as needed for muscle spasms. 30 each 0   Dextromethorphan-buPROPion ER (AUVELITY) 45-105 MG TBCR Take 45 each by mouth. 45mg      esomeprazole  (NEXIUM ) 40 MG capsule Take 1 capsule (40 mg total) by mouth 2 (two) times daily before a meal. 60 capsule 6   lisinopril (ZESTRIL) 5 MG tablet Take 5 mg by mouth daily.     traZODone (DESYREL) 50 MG tablet Take 50 mg by mouth at bedtime.     lidocaine  (LIDODERM ) 5 % Place 1 patch onto the skin daily. Remove & Discard patch within 12 hours or as directed by MD (Patient not taking: Reported on 10/18/2024) 30 patch 0   sucralfate  (CARAFATE ) 1 GM/10ML suspension Take 10 mLs (1 g total) by mouth 4 (four) times daily. (Patient not taking: Reported on 10/18/2024) 420 mL 1   No current facility-administered medications for this visit.    Allergies as of 10/18/2024 - Review Complete 10/18/2024  Allergen Reaction Noted   Aspirin Other (See Comments) 01/23/2017   Codeine Nausea And Vomiting 04/17/2022    Social History   Socioeconomic History   Marital status: Single    Spouse name: Not on file   Number of children: 1   Years of education: 15   Highest education level: Not on file  Occupational History   Occupation: runner, broadcasting/film/video asst    Comment: [employer]  Tobacco Use   Smoking status: Former   Smokeless tobacco: Never  Advertising Account Planner   Vaping status: Never Used  Substance and Sexual Activity   Alcohol use: No   Drug use: No   Sexual activity: Not Currently     Birth control/protection: None  Other Topics Concern   Not on file  Social History Narrative   Lives with mother and son Loletha   Working on degree in early childhood interdisciplinary studies   Social Drivers of Health   Tobacco Use: Medium Risk (10/18/2024)   Patient History    Smoking Tobacco Use: Former    Smokeless Tobacco Use: Never    Passive Exposure: Not on Actuary Strain: Not on file  Food Insecurity: Not on file  Transportation Needs: Not on file  Physical Activity: Not on file  Stress: Not on file  Social Connections: Not on file  Depression (PHQ2-9): Medium Risk (06/17/2022)   Depression (PHQ2-9)    PHQ-2 Score: 8  Alcohol Screen: Not on file  Housing: Not on file  Utilities: Not on file  Health Literacy: Not on file    Review of systems General: negative for malaise, night sweats, fever, chills, weight loss Neck: Negative for lumps, goiter, pain and significant neck swelling Resp: Negative for cough, wheezing, dyspnea at rest CV: Negative for chest pain, leg swelling, palpitations, orthopnea GI: denies melena, hematochezia, vomiting, diarrhea, constipation, dysphagia, odyonophagia, early satiety or unintentional weight loss. +upper abdominal pain +nausea  MSK: Negative for joint pain or swelling, back pain, and muscle pain. Derm:  Negative for itching or rash Psych: Denies depression, anxiety, memory loss, confusion. No homicidal or suicidal ideation.  Heme: Negative for prolonged bleeding, bruising easily, and swollen nodes. Endocrine: Negative for cold or heat intolerance, polyuria, polydipsia and goiter. Neuro: negative for tremor, gait imbalance, syncope and seizures. The remainder of the review of systems is noncontributory.  Physical Exam: BP 131/69   Pulse 72   Temp (!) 97.1 F (36.2 C)   Ht 5' 9 (1.753 m)   Wt (!) 308 lb (139.7 kg)   LMP 06/27/2020   BMI 45.48 kg/m  General:   Alert and oriented. No distress noted. Pleasant and  cooperative.  Head:  Normocephalic and atraumatic. Eyes:  Conjuctiva clear without scleral icterus. Mouth:  Oral mucosa pink and moist. Good dentition. No lesions. Heart: Normal rate and rhythm, s1 and s2 heart sounds present.  Lungs: Clear lung sounds in all lobes. Respirations equal and unlabored. Abdomen:  +BS, soft, non-tender and non-distended. No rebound or guarding. No HSM or masses noted. Derm: No palmar erythema or jaundice Msk:  Symmetrical without gross deformities. Normal posture. Extremities:  Without edema. Neurologic:  Alert and  oriented x4 Psych:  Alert and cooperative. Normal mood and affect.  Invalid input(s): 6 MONTHS   ASSESSMENT: JOANNY DUPREE is a 54 y.o. female presenting today for follow up of upper abdominal pain and nausea  Recent EGD and EUS as above with gastritis and esophageal cyst. Started on nexium  40mg  BID after EUS with some improvement though continues to have upper abdominal discomfort and nausea about 3 days per week, sometimes improved with eating. Thinks carafate  previously may have helped. Endorses a lot of stress. No alarm symptoms. At this time, will continue with PPI BID, add carafate  back on. If pain persist over the next month would be inclined to obtain cross sectional imaging and may consider fasting AM cortisol levels given reports of increased stress as well which may be likely contributing.    PLAN:  -continue nexium  40mg  BID -carafate  1g QID -consider CT A/P with contrast if pain persisting over the next month  -consider fasting AM cortisol levels   All questions were answered, patient verbalized understanding and is in agreement with plan as outlined above.   Follow Up: 2 months   Arsal Tappan L. Nieve Rojero, MSN, APRN, AGNP-C Adult-Gerontology Nurse Practitioner [facility]  "

## 2024-10-18 NOTE — Patient Instructions (Addendum)
-  continue nexium  40mg  twice daily  -carafate  1g 4 times per day  -consider imaging of your abdomen if pain persists over the next month  Follow up 2 months

## 2024-10-25 ENCOUNTER — Ambulatory Visit (HOSPITAL_COMMUNITY)
Admission: RE | Admit: 2024-10-25 | Discharge: 2024-10-25 | Disposition: A | Source: Ambulatory Visit | Attending: Internal Medicine | Admitting: Internal Medicine

## 2024-10-25 DIAGNOSIS — Z1231 Encounter for screening mammogram for malignant neoplasm of breast: Secondary | ICD-10-CM | POA: Insufficient documentation

## 2024-12-16 ENCOUNTER — Ambulatory Visit (INDEPENDENT_AMBULATORY_CARE_PROVIDER_SITE_OTHER): Admitting: Gastroenterology
